# Patient Record
Sex: Female | Born: 1970 | Race: White | Hispanic: No | Marital: Single | Smoking: Never smoker
Health system: Southern US, Community
[De-identification: ages and names within clinical notes are randomized; demographics above are authoritative.]

## PROBLEM LIST (undated history)

## (undated) DIAGNOSIS — R51 Headache: Secondary | ICD-10-CM

## (undated) DIAGNOSIS — K219 Gastro-esophageal reflux disease without esophagitis: Secondary | ICD-10-CM

## (undated) DIAGNOSIS — R519 Headache, unspecified: Secondary | ICD-10-CM

## (undated) HISTORY — DX: Headache: R51

## (undated) HISTORY — DX: Headache, unspecified: R51.9

## (undated) HISTORY — DX: Gastro-esophageal reflux disease without esophagitis: K21.9

---

## 1998-01-05 HISTORY — PX: DILATION AND CURETTAGE OF UTERUS: SHX78

## 2004-01-06 HISTORY — PX: TUBAL LIGATION: SHX77

## 2004-10-13 ENCOUNTER — Inpatient Hospital Stay: Payer: Self-pay

## 2006-03-29 ENCOUNTER — Ambulatory Visit: Payer: Self-pay | Admitting: Gynecology

## 2006-05-19 ENCOUNTER — Ambulatory Visit: Payer: Self-pay

## 2006-05-25 ENCOUNTER — Ambulatory Visit: Payer: Self-pay

## 2006-07-15 ENCOUNTER — Ambulatory Visit: Payer: Self-pay

## 2007-01-06 HISTORY — PX: OVARY SURGERY: SHX727

## 2007-01-06 HISTORY — PX: LAPAROSCOPY: SHX197

## 2007-11-11 ENCOUNTER — Emergency Department: Payer: Self-pay | Admitting: Emergency Medicine

## 2009-07-26 ENCOUNTER — Emergency Department: Payer: Self-pay | Admitting: Emergency Medicine

## 2009-12-25 ENCOUNTER — Emergency Department: Payer: Self-pay | Admitting: Emergency Medicine

## 2010-01-01 ENCOUNTER — Emergency Department: Payer: Self-pay | Admitting: Emergency Medicine

## 2010-03-03 ENCOUNTER — Ambulatory Visit (HOSPITAL_COMMUNITY): Payer: Self-pay

## 2010-03-03 ENCOUNTER — Other Ambulatory Visit (HOSPITAL_COMMUNITY): Payer: Self-pay | Admitting: Family Medicine

## 2010-03-03 DIAGNOSIS — R05 Cough: Secondary | ICD-10-CM

## 2010-03-25 ENCOUNTER — Emergency Department: Payer: Self-pay | Admitting: Emergency Medicine

## 2010-10-31 ENCOUNTER — Ambulatory Visit: Payer: Self-pay | Admitting: Internal Medicine

## 2013-02-15 ENCOUNTER — Ambulatory Visit: Payer: Self-pay

## 2014-05-29 IMAGING — US US BREAST*R* LIMITED INC AXILLA
1 series · 4 of 4 positions shown · non-contrast
Comparison: 03/19/2006.

CLINICAL DATA: Patient complains of a palpable abnormality in the
upper-outer quadrant of the right breast.

EXAM:
DIGITAL DIAGNOSTIC  BILATERAL MAMMOGRAM WITH CAD
ULTRASOUND RIGHT BREAST

[Series 1: us breast*right* limited inc axilla · 0.08mm/px · 4 of 4 slices shown]
[im 1/4]
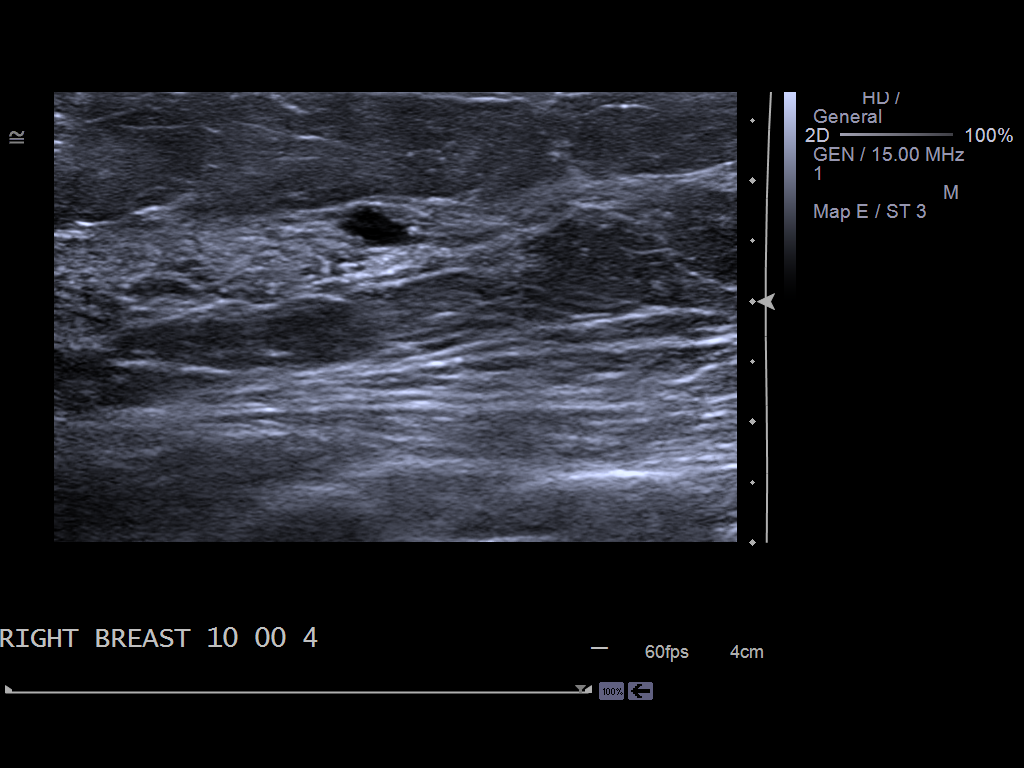
[im 2/4]
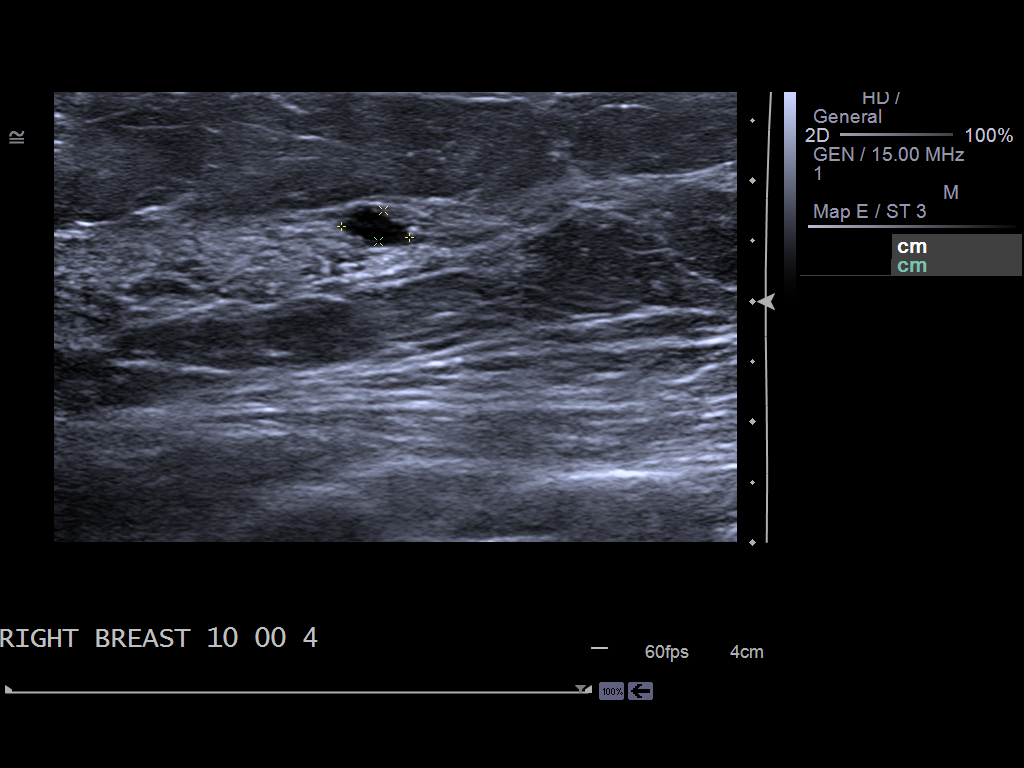
[im 3/4]
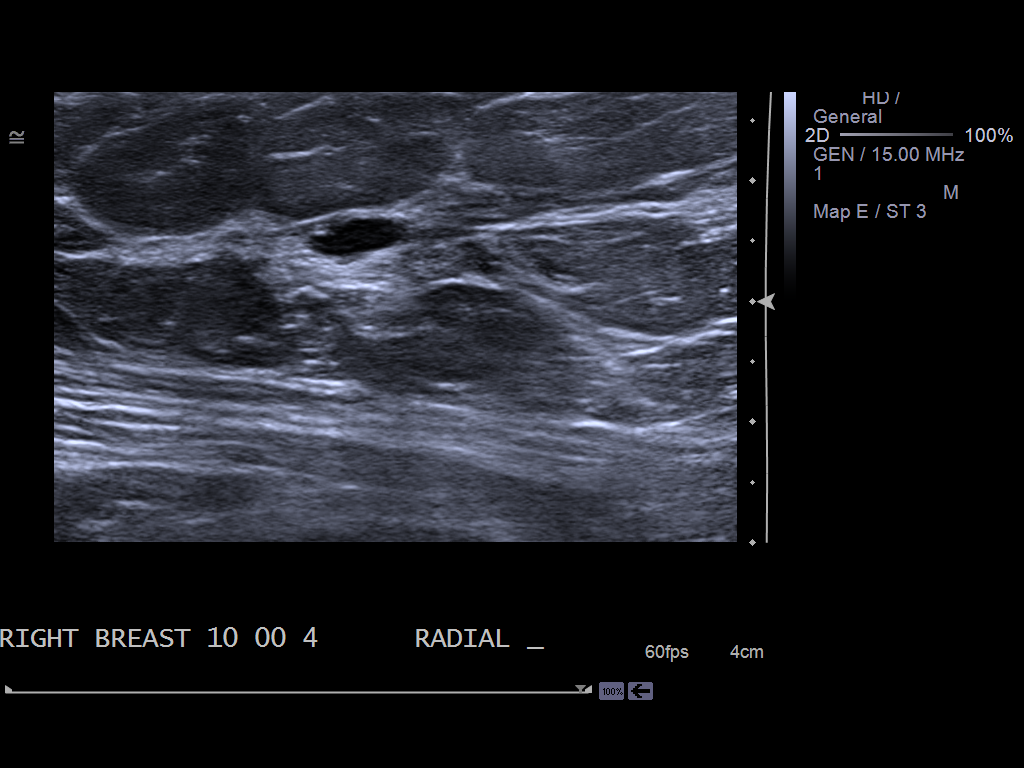
[im 4/4]
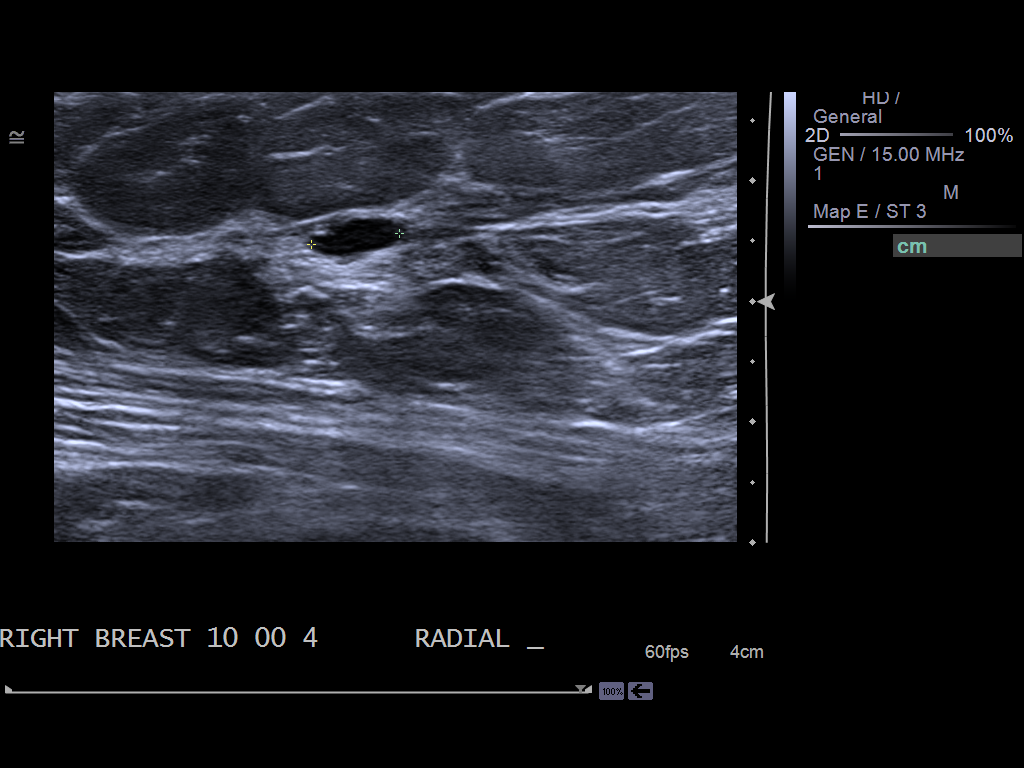

[4 of 4 positions shown; findings below may reference images not displayed]

ACR Breast Density Category b: There are scattered areas of
fibroglandular density.
FINDINGS: No suspicious mass, malignant type microcalcifications or distortion
detected in either breast.

Mammographic images were processed with CAD.

On physical exam, I do not palpate a mass in the right breast.

Ultrasound is performed, showing a simple cyst in the right breast
at 10 o'clock 4 cm from the nipple measuring 6 x 3 x 7 mm. No solid
mass or abnormal shadowing detected.
IMPRESSION: No evidence of malignancy in either breast.

RECOMMENDATION:
Bilateral screening mammogram in 1 year is recommended.

I have discussed the findings and recommendations with the patient.
Results were also provided in writing at the conclusion of the
visit. If applicable, a reminder letter will be sent to the patient
regarding the next appointment.

BI-RADS CATEGORY  2: Benign finding(s).

## 2014-11-12 ENCOUNTER — Ambulatory Visit: Payer: Self-pay | Attending: Oncology | Admitting: *Deleted

## 2014-11-12 ENCOUNTER — Encounter: Payer: Self-pay | Admitting: *Deleted

## 2014-11-12 VITALS — BP 132/82 | HR 68 | Temp 98.6°F | Resp 16 | Ht 61.42 in | Wt 168.8 lb

## 2014-11-12 DIAGNOSIS — N63 Unspecified lump in unspecified breast: Secondary | ICD-10-CM

## 2014-11-12 NOTE — Patient Instructions (Signed)
Gave patient hand-out, Women Staying Healthy, Active and Well from [facility], with education on breast health, pap smears, heart and colon health. 

## 2014-11-12 NOTE — Progress Notes (Signed)
Subjective:     Patient ID: Jenny Luna, female   DOB: Dec 29, 1970, 44 y.o.   MRN: [id number]  HPI   Review of Systems     Objective:   Physical Exam  Pulmonary/Chest: Right breast exhibits mass and tenderness. Right breast exhibits no inverted nipple, no nipple discharge and no skin change. Left breast exhibits no inverted nipple, no mass, no nipple discharge, no skin change and no tenderness. Breasts are symmetrical.         Assessment:      44 year [facility] White [facility]  States she can still feel a nodule in the right breast.  States she has tenderness when touched at the area.  Rates the tenderness a 5 on a 0/10 scale.  Last mammogram 2/15 was a birads 2 noting a right breast cyst at 10:00.  On clinical breast exam I was unable to palpate the area of concern until the patient pinpointed the area at 12:00.  There is a palpable 0.5 cm flat, mobile nodule at 12:00 right breast at the edge of the areola.  Taught self breast awareness. Patient has been screened for eligibility.  She does not have any insurance, Medicare or Medicaid.  She also meets financial eligibility.  Hand-out given on the Affordable Care Act.     Plan:     Bilateral diagnostic mammogram and ultrasound ordered and scheduled for 11/27/14 @ 9:00 for further evaluation of the right breast mass.

## 2014-11-27 ENCOUNTER — Ambulatory Visit
Admission: RE | Admit: 2014-11-27 | Discharge: 2014-11-27 | Disposition: A | Discharge status: Home / Self Care | Payer: Self-pay | Source: Ambulatory Visit | Attending: Oncology | Admitting: Oncology

## 2014-11-27 DIAGNOSIS — N63 Unspecified lump in unspecified breast: Secondary | ICD-10-CM

## 2014-11-28 ENCOUNTER — Telehealth: Payer: Self-pay | Admitting: *Deleted

## 2014-11-28 ENCOUNTER — Encounter: Payer: Self-pay | Admitting: General Surgery

## 2014-11-28 NOTE — Telephone Encounter (Signed)
Called patient today to discuss her normal mammogram and option to follow up with surgical consultation for further evaluation.  She would like a surgical consultation.  Patient is scheduled to see Dr. Lemar Luna on 12/11/14 @ 11:00.  She is to arrive 15-30 minutes early, take a photo ID and all her meds in the bottles.  Will follow up per Dr. Lemar Luna and protocol.

## 2014-12-11 ENCOUNTER — Other Ambulatory Visit: Payer: PRIVATE HEALTH INSURANCE

## 2014-12-11 ENCOUNTER — Encounter: Payer: Self-pay | Admitting: General Surgery

## 2014-12-11 ENCOUNTER — Ambulatory Visit (INDEPENDENT_AMBULATORY_CARE_PROVIDER_SITE_OTHER): Payer: PRIVATE HEALTH INSURANCE | Admitting: General Surgery

## 2014-12-11 VITALS — BP 128/68 | HR 62 | Resp 12 | Ht 59.7 in | Wt 169.0 lb

## 2014-12-11 DIAGNOSIS — N63 Unspecified lump in breast: Secondary | ICD-10-CM | POA: Diagnosis not present

## 2014-12-11 DIAGNOSIS — N6310 Unspecified lump in the right breast, unspecified quadrant: Secondary | ICD-10-CM

## 2014-12-11 NOTE — Patient Instructions (Signed)
Continue self breast exams. Call office for any new breast issues or concerns. 

## 2014-12-11 NOTE — Progress Notes (Signed)
Patient ID: Jenny Luna, female   DOB: 08-12-1970, 44 y.o.   MRN: [id number]  Chief Complaint  Patient presents with  . Breast Problem    right breast mass    HPI Jenny Luna is a 44 y.o. female.  who presents for a breast evaluation. The most recent mammogram was done on 11-27-14 with a right breast ultrasound.  Patient does perform regular self breast checks and gets regular mammograms done.  She states she can feel a lump in the right breast that is unchanged from 2015. She states it is about the size of a "pea". She states it has not changed in size. Occasionally she states it will be tender to touch between menstrual cycles. Denies family history of colon cancer. Her maternal grandmother had breast cancer and passed shortly after. I personally reviewed the patient's history. HPI  Past Medical History  Diagnosis Date  . Headache   . GERD (gastroesophageal reflux disease)     Past Surgical History  Procedure Laterality Date  . Dilation and curettage of uterus  2000  . Tubal ligation  2006  . Laparoscopy  2009  . Ovary surgery  2009    Cyst    Family History  Problem Relation Age of Onset  . Uterine cancer Mother 4530  . Breast cancer Maternal Grandmother [id number]    Social History Social History  Substance Use Topics  . Smoking status: Never Smoker   . Smokeless tobacco: Never Used  . Alcohol Use: 0.0 oz/week    0 Standard drinks or equivalent per week     Comment: occasionally    No Known Allergies  Current Outpatient Prescriptions  Medication Sig Dispense Refill  . ibuprofen (ADVIL,MOTRIN) 200 MG tablet Take 200 mg by mouth every 6 (six) hours as needed.     No current facility-administered medications for this visit.    Review of Systems Review of Systems  Constitutional: Negative.   Respiratory: Negative.   Cardiovascular: Negative.     Blood pressure 128/68, pulse 62, resp. rate 12, height 4' 11.7" (1.516 m), weight 169 lb (76.658 kg), last  menstrual period 11/15/2014.  Physical Exam Physical Exam  Constitutional: She is oriented to person, place, and time. She appears well-developed and well-nourished.  HENT:  Mouth/Throat: Oropharynx is clear and moist.  Eyes: Conjunctivae are normal. No scleral icterus.  Neck: Neck supple.  Cardiovascular: Normal rate, regular rhythm and normal heart sounds.   Pulmonary/Chest: Effort normal and breath sounds normal. Right breast exhibits mass. Right breast exhibits no inverted nipple, no nipple discharge, no skin change and no tenderness. Left breast exhibits no inverted nipple, no mass, no nipple discharge, no skin change and no tenderness.    Right breast nodule 10 o'clock 8 CFN.  Lymphadenopathy:    She has no cervical adenopathy.    She has no axillary adenopathy.  Neurological: She is alert and oriented to person, place, and time.  Skin: Skin is warm and dry.  Psychiatric: Her behavior is normal.    Data Reviewed 11/27/2014 bilateral mammogram and subsequent ultrasound reviewed. 2015 mammogram reviewed.  BI-RADS-2.  The area identified by the patient is distinctly more lateral and superior to the area reportedly [facility]. For that reason a repeat focused ultrasound of the upper-outer quadrant of the right breast was completed.  In the area of palpable thickening there is a poorly defined 0.4 x 0.4 x 0.46 cm nodule with central vascularity consistent with a lymph  node. No other area of architectural distortion, cyst or solid lesion is identified. BI-RADS-2.  Assessment    Benign breast exam, small palpable intramammary lymph node.    Plan    No indication for excision unless the area enlarges or becomes clinically symptomatic.    Follow up as needed or if symptoms worsen or change. Continue self breast exams. Call office for any new breast issues or concerns.   PCP:  Lenard Lance 12/12/2014, 5:35 PM

## 2014-12-12 DIAGNOSIS — N6310 Unspecified lump in the right breast, unspecified quadrant: Secondary | ICD-10-CM | POA: Insufficient documentation

## 2014-12-13 ENCOUNTER — Encounter: Payer: Self-pay | Admitting: *Deleted

## 2014-12-13 NOTE — Patient Instructions (Signed)
Patient saw Dr. Lemar LivingsByrnett for further evaluation.  Benign findings.  To follow-up in one year.  [facility] to [place].

## 2016-10-22 IMAGING — US US BREAST*R* LIMITED INC AXILLA
1 series · 10 of 10 positions shown · non-contrast
Comparison: none

[Series 1: us breast*right* limited inc axilla · 0.06mm/px · 10 of 10 slices shown]
[im 1/10]
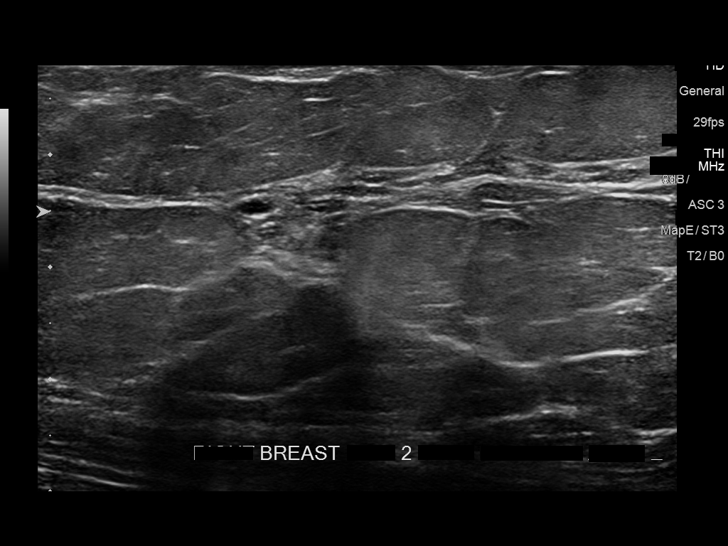
[im 2/10]
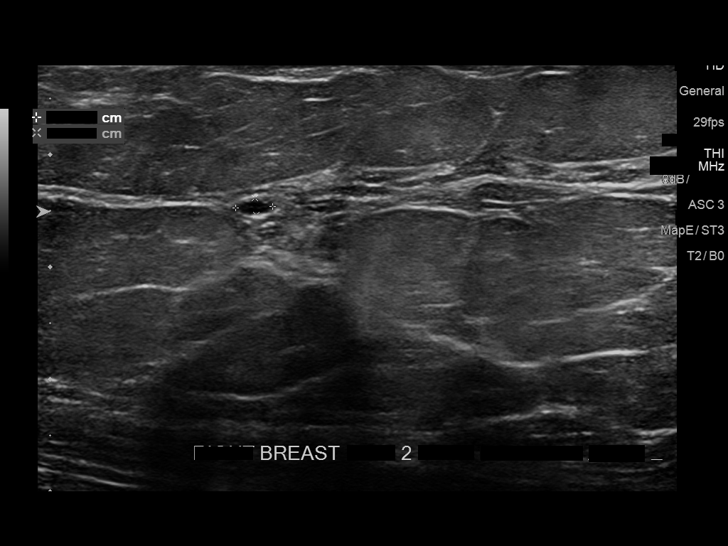
[im 3/10]
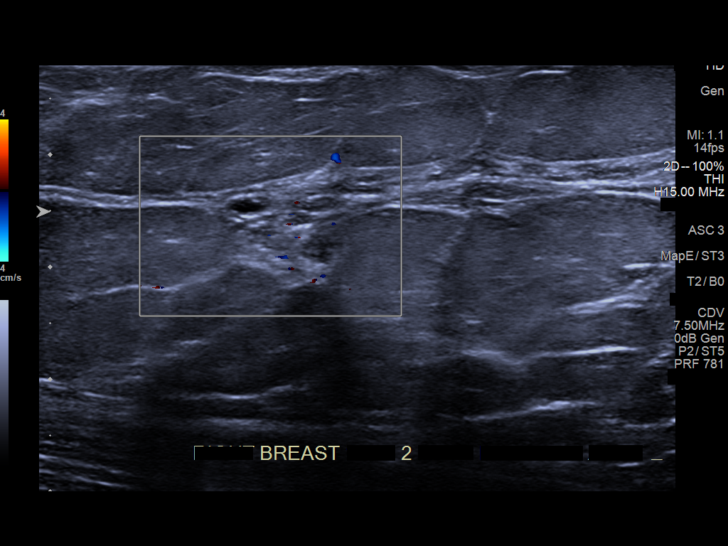
[im 4/10]
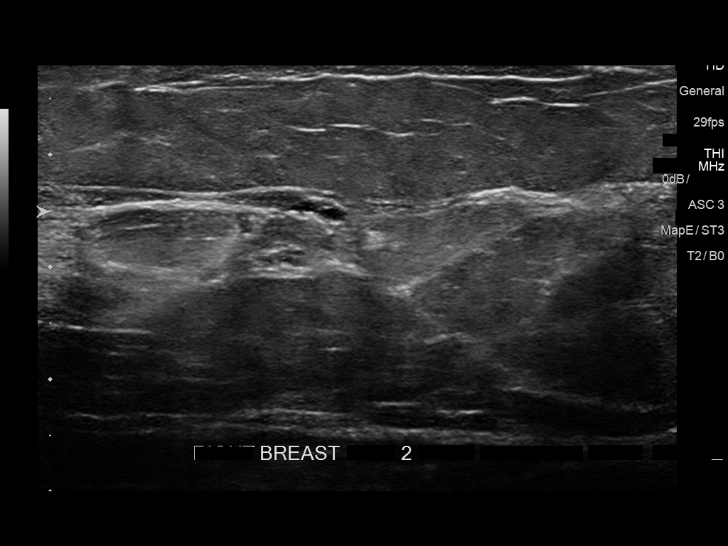
[im 5/10]
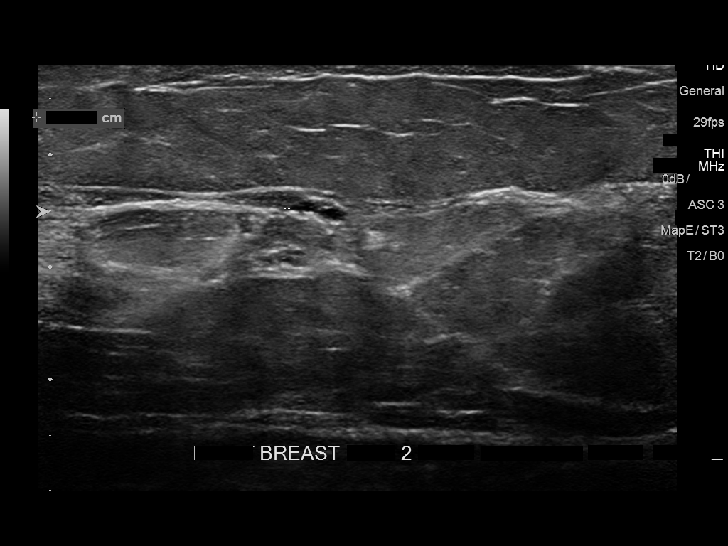
[im 6/10]
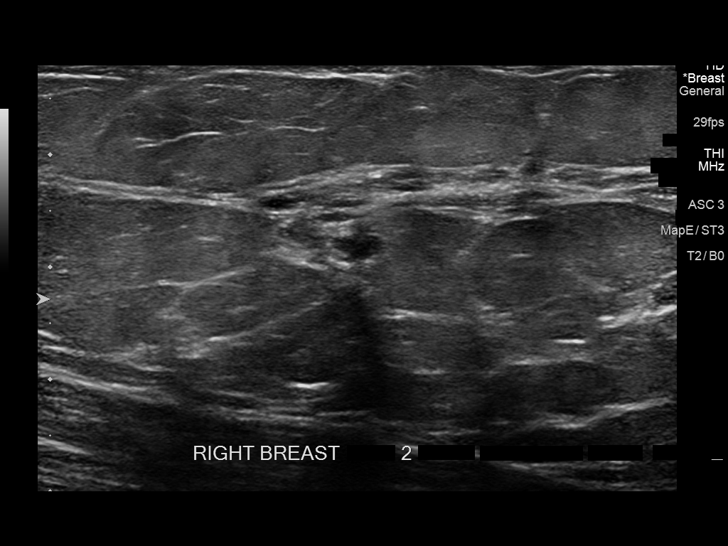
[im 7/10]
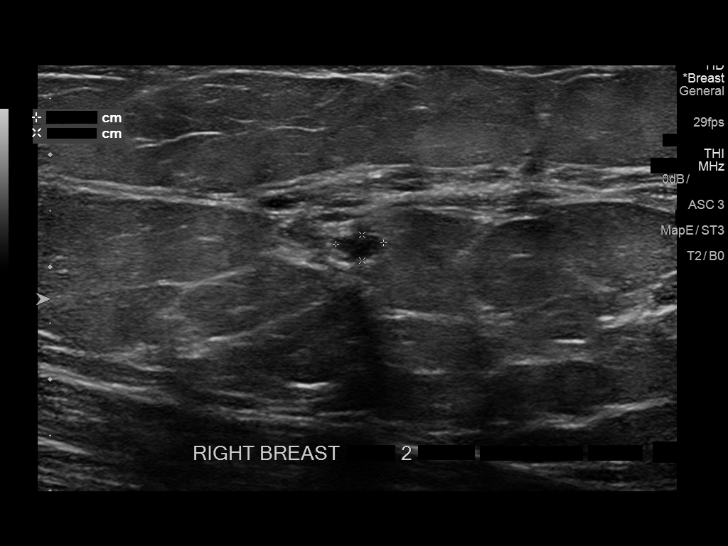
[im 8/10]
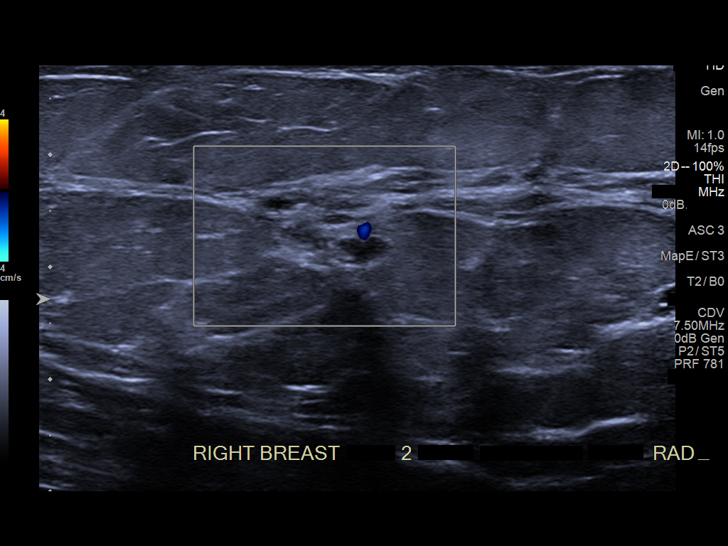
[im 9/10]
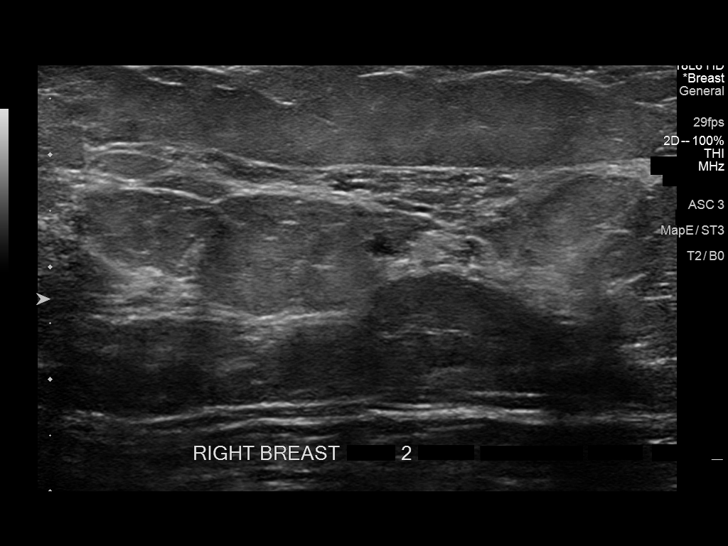
[im 10/10]
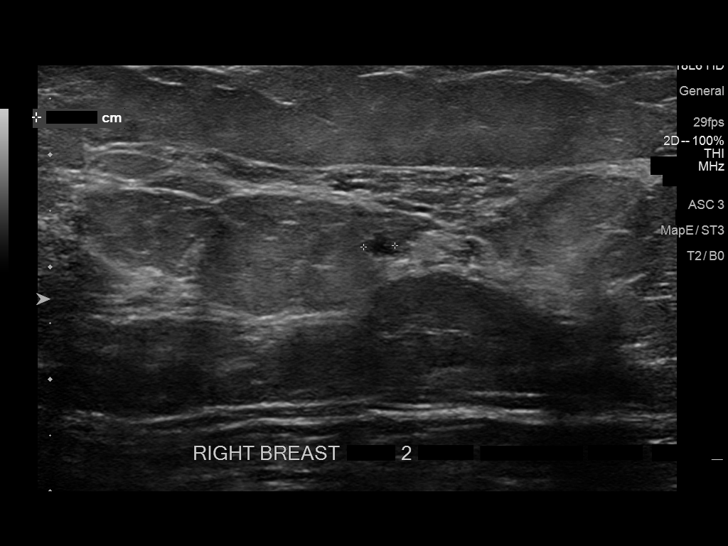

[10 of 10 positions shown; findings below may reference images not displayed]

Canned report from images found in remote index.

Refer to host system for actual result text.

## 2018-02-03 ENCOUNTER — Other Ambulatory Visit: Payer: Self-pay | Admitting: Physician Assistant

## 2019-01-11 ENCOUNTER — Ambulatory Visit
Admission: EM | Admit: 2019-01-11 | Discharge: 2019-01-11 | Disposition: A | Discharge status: Home / Self Care | Payer: BC Managed Care – PPO | Attending: Emergency Medicine | Admitting: Emergency Medicine

## 2019-01-11 ENCOUNTER — Other Ambulatory Visit: Payer: Self-pay

## 2019-01-11 DIAGNOSIS — J0100 Acute maxillary sinusitis, unspecified: Secondary | ICD-10-CM

## 2019-01-11 DIAGNOSIS — U071 COVID-19: Secondary | ICD-10-CM | POA: Diagnosis not present

## 2019-01-11 DIAGNOSIS — Z79899 Other long term (current) drug therapy: Secondary | ICD-10-CM | POA: Diagnosis not present

## 2019-01-11 DIAGNOSIS — K219 Gastro-esophageal reflux disease without esophagitis: Secondary | ICD-10-CM | POA: Insufficient documentation

## 2019-01-11 DIAGNOSIS — Z8616 Personal history of COVID-19: Secondary | ICD-10-CM

## 2019-01-11 DIAGNOSIS — J22 Unspecified acute lower respiratory infection: Secondary | ICD-10-CM | POA: Diagnosis present

## 2019-01-11 HISTORY — DX: Personal history of COVID-19: Z86.16

## 2019-01-11 MED ORDER — ALBUTEROL SULFATE HFA 108 (90 BASE) MCG/ACT IN AERS: 1.0000 | INHALATION_SPRAY | RESPIRATORY_TRACT | 0 refills | Status: AC | PRN

## 2019-01-11 MED ORDER — DOXYCYCLINE HYCLATE 100 MG PO CAPS: 100.0000 mg | ORAL_CAPSULE | Freq: Two times a day (BID) | ORAL | 0 refills | Status: AC

## 2019-01-11 MED ORDER — BENZONATATE 200 MG PO CAPS: 200.0000 mg | ORAL_CAPSULE | Freq: Three times a day (TID) | ORAL | 0 refills | Status: DC | PRN

## 2019-01-11 MED ORDER — AEROCHAMBER PLUS MISC: 2 refills | Status: DC

## 2019-01-11 MED ORDER — IBUPROFEN 600 MG PO TABS: 600.0000 mg | ORAL_TABLET | Freq: Four times a day (QID) | ORAL | 0 refills | Status: DC | PRN

## 2019-01-11 MED ORDER — FLUTICASONE PROPIONATE 50 MCG/ACT NA SUSP: 2.0000 | Freq: Every day | NASAL | 0 refills | Status: DC

## 2019-01-11 MED ORDER — HYDROCOD POLST-CPM POLST ER 10-8 MG/5ML PO SUER: 5.0000 mL | Freq: Two times a day (BID) | ORAL | 0 refills | Status: DC | PRN

## 2019-01-11 NOTE — ED Provider Notes (Signed)
HPI  SUBJECTIVE:  Jenny Luna is a 49 y.o. female who presents with 3 days of frontal and maxillary sinus pain and pressure, clear rhinorrhea, upper dental pain, postnasal drip.  She reports bilateral ear pain described as pressure and itching starting today.  No change in hearing, otorrhea.  She reports some wheezing, shortness of breath, dyspnea on exertion.  States that her chest and back are sore from the cough, denies other chest pain.  She reports a cough worse at night, states that she cannot sleep secondary to the cough.  No fevers, purulent nasal congestion, facial swelling, allergy symptoms.  No body aches, other headaches, sore throat, fatigue, loss of sense of smell or taste, nausea, vomiting, abdominal pain.  No known Covid exposure.  No antibiotics in the past month.  No antipyretic in the past 4 to 6 hours.  She tried Mucinex, DayQuil, NyQuil, pushing fluids without improvement in her symptoms.  Symptoms are worse during the day.  States that she gets this once a year and it is usually diagnosed as sinusitis/bronchitis.  Past medical history negative for asthma, emphysema, COPD, smoking, diabetes, coronary artery disease, chronic kidney disease, HIV, cancer, immunosuppression.  PMD: Plans to establish care with Dr. Franco Nones  Past Medical History:  Diagnosis Date  . GERD (gastroesophageal reflux disease)   . Headache     Past Surgical History:  Procedure Laterality Date  . DILATION AND CURETTAGE OF UTERUS  2000  . LAPAROSCOPY  2009  . OVARY SURGERY  2009   Cyst  . TUBAL LIGATION  2006    Family History  Problem Relation Age of Onset  . Uterine cancer Mother 31  . Breast cancer Maternal Grandmother 52    Social History   Tobacco Use  . Smoking status: Never Smoker  . Smokeless tobacco: Never Used  Substance Use Topics  . Alcohol use: Yes    Alcohol/week: 0.0 standard drinks    Comment: occasionally  . Drug use: No    No current facility-administered  medications for this encounter.  Current Outpatient Medications:  .  albuterol (VENTOLIN HFA) 108 (90 Base) MCG/ACT inhaler, Inhale 1-2 puffs into the lungs every 4 (four) hours as needed for wheezing or shortness of breath., Disp: 18 g, Rfl: 0 .  benzonatate (TESSALON) 200 MG capsule, Take 1 capsule (200 mg total) by mouth 3 (three) times daily as needed for cough., Disp: 30 capsule, Rfl: 0 .  chlorpheniramine-HYDROcodone (TUSSIONEX PENNKINETIC ER) 10-8 MG/5ML SUER, Take 5 mLs by mouth every 12 (twelve) hours as needed for cough., Disp: 60 mL, Rfl: 0 .  fluticasone (FLONASE) 50 MCG/ACT nasal spray, Place 2 sprays into both nostrils daily., Disp: 16 g, Rfl: 0 .  ibuprofen (ADVIL) 600 MG tablet, Take 1 tablet (600 mg total) by mouth every 6 (six) hours as needed., Disp: 30 tablet, Rfl: 0 .  Spacer/Aero-Holding Chambers (AEROCHAMBER PLUS) inhaler, Use as instructed, Disp: 1 each, Rfl: 2  No Known Allergies   ROS  As noted in HPI.   Physical Exam  BP 140/87 (BP Location: Left Arm)   Pulse 77   Temp 98.4 F (36.9 C) (Oral)   Resp 18   Ht 5' (1.524 m)   Wt 81.6 kg   LMP 12/28/2018   SpO2 99%   BMI 35.15 kg/m   Constitutional: Well developed, well nourished, no acute distress Eyes:  EOMI, conjunctiva normal bilaterally HENT: Normocephalic, atraumatic,mucus membranes moist TMs normal bilaterally.  Erythematous, swollen turbinates.  Mucoid nasal  discharge.  Positive maxillary sinus tenderness.  No frontal sinus tenderness.  Positive postnasal drip. Respiratory: Normal inspiratory effort, lungs clear bilaterally, positive anterior, lateral chest wall tenderness Cardiovascular: Normal rate and rhythm no murmurs rubs or gallops GI: nondistended skin: No rash, skin intact Musculoskeletal: no deformities Neurologic: Alert & oriented x 3, no focal neuro deficits Psychiatric: Speech and behavior appropriate   ED Course   Medications - No data to display  Orders Placed This Encounter   Procedures  . Novel Coronavirus, N[facility] (Hosp order, Send-out to Ref Lab; TAT 18-24 hrs    Standing Status:   Standing    Number of Occurrences:   1    Order Specific Question:   Is this test for diagnosis or screening    Answer:   Diagnosis of ill patient    Order Specific Question:   Symptomatic for COVID-19 as defined by CDC    Answer:   Yes    Order Specific Question:   Date of Symptom Onset    Answer:   01/08/2019    Order Specific Question:   Hospitalized for COVID-19    Answer:   No    Order Specific Question:   Admitted to ICU for COVID-19    Answer:   No    Order Specific Question:   Previously tested for COVID-19    Answer:   No    Order Specific Question:   Resident in a congregate (group) care setting    Answer:   No    Order Specific Question:   Employed in healthcare setting    Answer:   No    Order Specific Question:   Pregnant    Answer:   No    No results found for this or any previous visit (from the past 24 hour(s)). No results found.  ED Clinical Impression  1. Acute non-recurrent maxillary sinusitis   2. LRTI (lower respiratory tract infection)      ED Assessment/Plan  [place] Narc[place] reviewed for this patient, and feel that the risk/benefit ratio today is favorable for proceeding with a prescription for controlled substance.  No opiate prescriptions  2 years  Patient with a sinusitis, most likely viral.  No clear indications for antibiotics.  She also has lower respiratory tract infection.  Sending home with Flonase, saline nasal irrigation, continue Mucinex D.  Ibuprofen 600 mg combined with 1 g of Tylenol 3-4 times a day as needed for pain.  2 puffs from an albuterol inhaler with a spacer as needed for coughing, wheezing, shortness of breath.  Tessalon for the cough during the day, tussionex for the cough at night.  Wait-and-see prescription of doxycycline.  Discussed indications for starting this.  Covid PCR test sent.  Discussed  MDM, treatment plan,  and plan for follow-up with patient. patient agrees with plan.   Meds ordered this encounter  Medications  . albuterol (VENTOLIN HFA) 108 (90 Base) MCG/ACT inhaler    Sig: Inhale 1-2 puffs into the lungs every 4 (four) hours as needed for wheezing or shortness of breath.    Dispense:  18 g    Refill:  0  . fluticasone (FLONASE) 50 MCG/ACT nasal spray    Sig: Place 2 sprays into both nostrils daily.    Dispense:  16 g    Refill:  0  . Spacer/Aero-Holding Chambers (AEROCHAMBER PLUS) inhaler    Sig: Use as instructed    Dispense:  1 each    Refill:  2  .  chlorpheniramine-HYDROcodone (TUSSIONEX PENNKINETIC ER) 10-8 MG/5ML SUER    Sig: Take 5 mLs by mouth every 12 (twelve) hours as needed for cough.    Dispense:  60 mL    Refill:  0  . benzonatate (TESSALON) 200 MG capsule    Sig: Take 1 capsule (200 mg total) by mouth 3 (three) times daily as needed for cough.    Dispense:  30 capsule    Refill:  0  . ibuprofen (ADVIL) 600 MG tablet    Sig: Take 1 tablet (600 mg total) by mouth every 6 (six) hours as needed.    Dispense:  30 tablet    Refill:  0    *This clinic note was created using Scientist, clinical (histocompatibility and immunogenetics). Therefore, there may be occasional mistakes despite careful proofreading.   ?    Domenick Gong, MD 01/12/19 1135

## 2019-01-11 NOTE — ED Triage Notes (Signed)
Patient states that she has been having a cough with sinus pressure and pain. Patient states that she is also having a headache. Patient states that she was around a girl who possibly is positive for covid.

## 2019-01-11 NOTE — Discharge Instructions (Addendum)
Take the medication as written. Start Mucinex-D to keep the mucous thin and to decongest you.  Stop the DayQuil and NyQuil.  Return to the ER if you get worse, have a fever >100.4, or for any concerns. You may take 600 mg of motrin with 1 gram of tylenol up to 3-4 times a day as needed for pain. This is an effective combination for pain.  Most sinus infections are viral and do not need antibiotics unless you have a high fever, have had this for 10 days, or you get better and then get sick again. Use a NeilMed sinus rinse as often as you want to to reduce nasal congestion. Follow the directions on the box.    2 puffs from an albuterol inhaler with a spacer as needed for coughing, wheezing, shortness of breath.  Tessalon for the cough during the day, tussionex for the cough at night.  Wait-and-see prescription of doxycycline.   Go to www.goodrx.com to look up your medications. This will give you a list of where you can find your prescriptions at the most affordable prices. Or you can ask the pharmacist what the cash price is. This is frequently cheaper than going through insurance.

## 2019-01-12 LAB — NOVEL CORONAVIRUS, NAA (HOSP ORDER, SEND-OUT TO REF LAB; TAT 18-24 HRS): SARS-CoV-2, NAA: DETECTED — AB

## 2019-01-13 ENCOUNTER — Telehealth: Payer: Self-pay | Admitting: Emergency Medicine

## 2019-01-13 NOTE — Telephone Encounter (Signed)

## 2019-01-13 NOTE — Telephone Encounter (Signed)
Patient contacted by phone and made aware of   pos covid results. Pt verbalized understanding and had all questions answered.    

## 2019-04-20 ENCOUNTER — Other Ambulatory Visit: Payer: Self-pay

## 2019-04-20 ENCOUNTER — Ambulatory Visit
Admission: EM | Admit: 2019-04-20 | Discharge: 2019-04-20 | Disposition: A | Discharge status: Home / Self Care | Payer: BC Managed Care – PPO | Attending: Urgent Care | Admitting: Urgent Care

## 2019-04-20 DIAGNOSIS — H748X3 Other specified disorders of middle ear and mastoid, bilateral: Secondary | ICD-10-CM | POA: Diagnosis not present

## 2019-04-20 DIAGNOSIS — J0100 Acute maxillary sinusitis, unspecified: Secondary | ICD-10-CM

## 2019-04-20 DIAGNOSIS — R42 Dizziness and giddiness: Secondary | ICD-10-CM

## 2019-04-20 DIAGNOSIS — H6593 Unspecified nonsuppurative otitis media, bilateral: Secondary | ICD-10-CM

## 2019-04-20 MED ORDER — AMOXICILLIN-POT CLAVULANATE 875-125 MG PO TABS: 1.0000 | ORAL_TABLET | Freq: Two times a day (BID) | ORAL | 0 refills | Status: AC

## 2019-04-20 MED ORDER — MECLIZINE HCL 25 MG PO TABS: 25.0000 mg | ORAL_TABLET | Freq: Three times a day (TID) | ORAL | 0 refills | Status: AC | PRN

## 2019-04-20 NOTE — Discharge Instructions (Addendum)
It was very nice seeing you today in clinic. Thank you for entrusting me with your care.   Rest and increase fluid intake. Add Sudafed to help clear/dry up effusions.   Make arrangements to follow up with your regular doctor in 1 week for re-evaluation if not improving. If your symptoms/condition worsens, please seek follow up care either here or in the ER. Please remember, our [facility] providers are "right here with you" when you need [place].   Again, it was my pleasure to take care of you today. Thank you for choosing our clinic. I hope that you start to feel better quickly.   Quentin Mulling, MSN, APRN, FNP-C, CEN Advanced Practice Provider [facility]

## 2019-04-20 NOTE — ED Provider Notes (Signed)
Luna, Jenny   Name: Jenny Luna DOB: Nov 20, 1970 MRN: [id number] CSN: [id number] PCP: Patient, No Pcp Per  Arrival date and time:  04/20/19 2671  Chief Complaint:  Dizziness  NOTE: Prior to seeing the patient today, I have reviewed the triage nursing documentation and vital signs. Clinical staff has updated patient's PMH/PSHx, current medication list, and drug allergies/intolerances to ensure comprehensive history available to assist in medical decision making.   History:   HPI: Jenny Luna is a 49 y.o. female who presents today with complaints of sinus drainage, paranasal sinus tenderness, and RIGHT ear pain that started about 2 weeks ago. Patient denies fevers. She denies any cough, shortness of breath, or wheezing. She woke up with morning with vertiginous symptoms. Patient is eating and drinking well. She has had nausea with no episodes of vomiting. She has not had any diarrhea or abdominal pain. Patient denies being in close contact with anyone known to be ill. Patient was diagnosed with SARS-CoV-2 (novel coronavirus) back in 01/2019. In efforts to conservatively manage her symptoms at home, the patient notes that she has used meclizine and guaifenesin, which has helped to improve her symptoms some.    Past Medical History:  Diagnosis Date  . GERD (gastroesophageal reflux disease)   . Headache   . History of 2019 novel coronavirus disease (COVID-19) 01/11/2019    Past Surgical History:  Procedure Laterality Date  . DILATION AND CURETTAGE OF UTERUS  2000  . LAPAROSCOPY  2009  . OVARY SURGERY  2009   Cyst  . TUBAL LIGATION  2006    Family History  Problem Relation Age of Onset  . Uterine cancer Mother 32  . Breast cancer Maternal Grandmother 51    Social History   Tobacco Use  . Smoking status: Never Smoker  . Smokeless tobacco: Never Used  Substance Use Topics  . Alcohol use: Yes    Alcohol/week: 0.0 standard drinks    Comment: occasionally  . Drug use:  No    Patient Active Problem List   Diagnosis Date Noted  . Breast mass, right 12/12/2014    Home Medications:    No outpatient medications have been marked as taking for the 04/20/19 encounter [facility] Encounter).    Allergies:   Patient has no known allergies.  Review of Systems (ROS):  Review of systems NEGATIVE unless otherwise noted in narrative H&P section.   Vital Signs: Today's Vitals   04/20/19 0817 04/20/19 0819 04/20/19 0838  BP:  132/78   Pulse:  (!) 56   Resp:  18   Temp:  98.1 F (36.7 C)   TempSrc:  Oral   SpO2:  100%   Weight: 185 lb (83.9 kg)    Height: 4' 11.75" (1.518 m)    PainSc: 0-No pain  0-No pain    Physical Exam: Physical Exam  Constitutional: She is oriented to person, place, and time and well-developed, well-nourished, and in no distress.  HENT:  Head: Normocephalic and atraumatic.  Right Ear: There is tenderness. Tympanic membrane is erythematous. A middle ear effusion is present.  Left Ear: A middle ear effusion is present.  Nose: Mucosal edema and rhinorrhea present. Right sinus exhibits maxillary sinus tenderness.  Mouth/Throat: Uvula is midline and mucous membranes are normal. Posterior oropharyngeal erythema present. No oropharyngeal exudate or posterior oropharyngeal edema.  Eyes: Pupils are equal, round, and reactive to light.  Cardiovascular: Normal rate, regular rhythm, normal heart sounds and intact distal pulses.  Pulmonary/Chest: Effort normal and  breath sounds normal.  Lymphadenopathy:       Head (right side): Submandibular adenopathy present.  Neurological: She is alert and oriented to person, place, and time. She has normal sensation and intact cranial nerves. Gait normal.  Skin: Skin is warm and dry. No rash noted. She is not diaphoretic.  Psychiatric: Mood, memory, affect and judgment normal.  Nursing note and vitals reviewed.   Urgent Care Treatments / Results:   No orders of the defined types were placed in this  encounter.   LABS: PLEASE NOTE: all labs that were ordered this encounter are listed, however only abnormal results are displayed. Labs Reviewed - No data to display  EKG: -None  RADIOLOGY: No results found.  PROCEDURES: Procedures  MEDICATIONS RECEIVED THIS VISIT: Medications - No data to display  PERTINENT CLINICAL COURSE NOTES/UPDATES:   Initial Impression / Assessment and Plan / Urgent Care Course:  Pertinent labs & imaging results that were available during my care of the patient were personally reviewed by me and considered in my medical decision making (see lab/imaging section of note for values and interpretations).  Jenny Luna is a 49 y.o. female who presents to [facility] today with complaints of Dizziness  Patient is well appearing overall in clinic today. She does not appear to be in any acute distress. Presenting symptoms (see HPI) and exam as documented above. Symptoms have been present x 2 weeks ago. Patient developed vertiginous symptoms this morning. Will cover for an acute maxillary sinusitis using a 7 day course of amoxicillin-clavulanate. Discussed supportive care measures at home during acute phase of illness. Patient to rest as much as possible. She was encouraged to ensure adequate hydration (water and ORS) to prevent dehydration and electrolyte derangements. Patient may use APAP and/or IBU on an as needed basis for pain/fever. Will also send in a prescription for a PRN supply of meclizine. Patient advised to use Sudafed to help abate MEEs.   Discussed follow up with primary care physician in 1 week for re-evaluation. I have reviewed the follow up and strict return precautions for any new or worsening symptoms. Patient is aware of symptoms that would be deemed urgent/emergent, and would thus require further evaluation either here or in the emergency department. At the time of discharge, she verbalized understanding and consent with the discharge plan  as it was reviewed with her. All questions were fielded by provider and/or clinic staff prior to patient discharge.    Final Clinical Impressions / Urgent Care Diagnoses:   Final diagnoses:  Acute maxillary sinusitis, recurrence not specified  Dizziness  MEE (middle ear effusion), bilateral    New Prescriptions:  [place] Controlled Substance Registry consulted? Not Applicable  Meds ordered this encounter  Medications  . amoxicillin-clavulanate (AUGMENTIN) 875-125 MG tablet    Sig: Take 1 tablet by mouth 2 (two) times daily for 7 days.    Dispense:  14 tablet    Refill:  0  . meclizine (ANTIVERT) 25 MG tablet    Sig: Take 1 tablet (25 mg total) by mouth 3 (three) times daily as needed for dizziness.    Dispense:  30 tablet    Refill:  0    Recommended Follow up Care:  Patient encouraged to follow up with the following provider within the specified time frame, or sooner as dictated by the severity of her symptoms. As always, she was instructed that for any urgent/emergent care needs, she should seek care either here or in the emergency department  for more immediate evaluation.  Follow-up Information    PCP In 1 week.   Why: General reassessment of symptoms if not improving        NOTE: This note was prepared using Scientist, clinical (histocompatibility and immunogenetics) along with smaller Lobbyist. Despite my best ability to proofread, there is the potential that transcriptional errors may still occur from this process, and are completely unintentional.    Verlee Monte, NP 04/20/19 1910

## 2019-04-20 NOTE — ED Triage Notes (Signed)
Pt presents with c/o episodes of vertigo that started this morning. Pt does have some sinus congestion, right ear pain and nausea. Pt denies fever/chills, v/d or other symptoms. Pt has had episodes of vertigo in the past.

## 2021-06-11 ENCOUNTER — Encounter: Payer: Self-pay | Admitting: Obstetrics and Gynecology

## 2021-06-17 ENCOUNTER — Encounter: Payer: Self-pay | Admitting: Obstetrics and Gynecology

## 2021-06-17 ENCOUNTER — Ambulatory Visit (INDEPENDENT_AMBULATORY_CARE_PROVIDER_SITE_OTHER): Payer: BC Managed Care – PPO | Admitting: Obstetrics and Gynecology

## 2021-06-17 VITALS — BP 132/79 | HR 78 | Ht 59.75 in | Wt 179.3 lb

## 2021-06-17 DIAGNOSIS — Z7689 Persons encountering health services in other specified circumstances: Secondary | ICD-10-CM | POA: Diagnosis not present

## 2021-06-17 DIAGNOSIS — N938 Other specified abnormal uterine and vaginal bleeding: Secondary | ICD-10-CM | POA: Diagnosis not present

## 2021-06-17 DIAGNOSIS — N926 Irregular menstruation, unspecified: Secondary | ICD-10-CM

## 2021-06-17 DIAGNOSIS — Z3009 Encounter for other general counseling and advice on contraception: Secondary | ICD-10-CM

## 2021-06-17 MED ORDER — NORETHIN ACE-ETH ESTRAD-FE 1.5-30 MG-MCG PO TABS: 1.0000 | ORAL_TABLET | Freq: Every day | ORAL | 1 refills | Status: DC

## 2021-06-17 NOTE — Progress Notes (Signed)
HPI:      [name] is a 51 y.o. G5P0010 who LMP was Patient's last menstrual period was 06/15/2021 (exact date).  Subjective:   She presents today with complaint of heavy irregular cycles.  She states over the last 9 months she has had heavy vaginal bleeding with her menses.  She has also had some menses that last up to 3 weeks long.  She does complain of hot flashes.  She was prescribed a 82-month birth control pill which did not control her cycle.  She continued to have bleeding 1 time per month on that pill. She also states that she has a history of "2 uteruses".  When questioned she describes this as a single cervix but to uteri.  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 She does not have any pertinent problems on file. She  has a past surgical history that includes Dilation and curettage of uterus (2000); Tubal ligation (2006); laparoscopy (2009); and Ovary surgery (2009). Her family history includes Breast cancer (age of onset: 33) in her maternal grandmother; Uterine cancer (age of onset: 34) in her mother. She  reports that she has never smoked. She has never used smokeless tobacco. She reports that she does not currently use alcohol. She reports that she does not use drugs. She has a current medication list which includes the following prescription(s): albuterol, ibuprofen, meclizine, and norethindrone-ethinyl estradiol-iron. She has No Known Allergies.       Review of Systems:  Review of Systems  Constitutional: Denied constitutional symptoms, night sweats, recent illness, fatigue, fever, insomnia and weight loss.  Eyes: Denied eye symptoms, eye pain, photophobia, vision change and visual disturbance.   Ears/Nose/Throat/Neck: Denied ear, nose, throat or neck symptoms, hearing loss, nasal discharge, sinus congestion and sore throat.  Cardiovascular: Denied cardiovascular symptoms, arrhythmia, chest pain/pressure, edema, exercise intolerance, orthopnea and palpitations.  Respiratory: Denied pulmonary symptoms, asthma, pleuritic pain, productive sputum, cough, dyspnea and wheezing.  Gastrointestinal: Denied, gastro-esophageal reflux, melena, nausea and vomiting.  Genitourinary: See HPI for additional information.  Musculoskeletal: Denied musculoskeletal symptoms, stiffness, swelling, muscle weakness and myalgia.  Dermatologic: Denied dermatology symptoms, rash and scar.  Neurologic: Denied neurology symptoms, dizziness, headache, neck pain and syncope.  Psychiatric: Denied psychiatric symptoms, anxiety and depression.  Endocrine: Denied endocrine symptoms including hot flashes and night sweats.   Meds:   Current Outpatient Medications on File Prior to Visit  Medication Sig Dispense Refill   albuterol (VENTOLIN HFA) 108 (90 Base) MCG/ACT inhaler Inhale 1-2 puffs into the lungs every 4 (four) hours as needed for wheezing or shortness of breath. 18 g 0   ibuprofen (ADVIL) 600 MG tablet Take 1 tablet (600 mg total) by mouth every 6 (six) hours as needed. 30 tablet 0   meclizine (ANTIVERT) 25 MG tablet Take 1 tablet (25 mg total) by mouth 3 (three) times daily as needed for dizziness. 30 tablet 0   No current facility-administered medications on file prior to visit.      Objective:     Vitals:   06/17/21 1346  BP: 132/79  Pulse: 78   Filed Weights   06/17/21 1346  Weight: 179 lb 4.8 oz (81.3 kg)  Assessment:    G5P0010 Patient Active Problem List   Diagnosis Date Noted   Breast mass, right 12/12/2014     1. Establishing care with new doctor, encounter for   2. Irregular menstrual bleeding   3. DUB (dysfunctional uterine bleeding)     Patient with  dysfunctional uterine bleeding which is likely secondary to perimenopause.   Plan:            1.  Discussed multiple methods of cycle control patient has chosen a different form of OCPs for better cycle control.  Should she fail OCPs would strongly recommend endometrial biopsy.  Endometrial hyperplasia and endometrial cancer discussed in detail. IUD may not be appropriate if patient has uterine didelphys or bicornuate uterus.  Would require ultrasound investigation first. All questions answered. OCPs The risks /benefits of OCPs have been explained to the patient in detail.  Product literature has been given to her where appropriate.  I have instructed her in the use of OCPs.  I have explained to the patient that OCPs are not as effective for birth control during the first month of use, and that another form of contraception should be used during this time.  Both first-day start and Sunday start have been explained.  The risks and benefits of each was discussed.  She has been made aware of  the fact that in rare circumstances, other medications may affect the efficacy of OCPs.  I have answered all of her questions, and I believe that she has an understanding of the effectiveness and use of OCPs.  Orders No orders of the defined types were placed in this encounter.    Meds ordered this encounter  Medications   norethindrone-ethinyl estradiol-iron (LOESTRIN FE 1.5/30) 1.5-30 MG-MCG tablet    Sig: Take 1 tablet by mouth at bedtime.    Dispense:  84 tablet    Refill:  1      F/U  Return in about 3 months (around 09/17/2021).  Elonda Husky, M.D. 06/17/2021 2:19 PM

## 2021-06-17 NOTE — Progress Notes (Signed)
Patient presents today for lengthy irregular menstrual cycles. She states over the last 9 months she had has a menstrual cycle that varies from 7-20+ days, heavy bleeding along with moderate cramping. She states ongoing hot flashes and weight gain, no other menopausal symptoms. Patient states no other questions or concerns.

## 2021-09-05 ENCOUNTER — Other Ambulatory Visit: Payer: Self-pay | Admitting: Obstetrics and Gynecology

## 2021-09-05 DIAGNOSIS — N938 Other specified abnormal uterine and vaginal bleeding: Secondary | ICD-10-CM

## 2021-09-23 ENCOUNTER — Encounter: Payer: BC Managed Care – PPO | Admitting: Obstetrics and Gynecology

## 2021-09-23 DIAGNOSIS — N938 Other specified abnormal uterine and vaginal bleeding: Secondary | ICD-10-CM

## 2021-11-06 ENCOUNTER — Encounter: Payer: Self-pay | Admitting: Obstetrics and Gynecology

## 2021-11-06 ENCOUNTER — Ambulatory Visit: Payer: BC Managed Care – PPO | Admitting: Obstetrics and Gynecology

## 2021-11-06 VITALS — BP 132/82 | HR 64 | Ht 59.75 in | Wt 182.5 lb

## 2021-11-06 DIAGNOSIS — N938 Other specified abnormal uterine and vaginal bleeding: Secondary | ICD-10-CM

## 2021-11-06 DIAGNOSIS — N926 Irregular menstruation, unspecified: Secondary | ICD-10-CM | POA: Diagnosis not present

## 2021-11-06 DIAGNOSIS — R399 Unspecified symptoms and signs involving the genitourinary system: Secondary | ICD-10-CM | POA: Diagnosis not present

## 2021-11-06 DIAGNOSIS — R319 Hematuria, unspecified: Secondary | ICD-10-CM

## 2021-11-06 LAB — POCT URINALYSIS DIPSTICK
Bilirubin, UA: NEGATIVE
Glucose, UA: NEGATIVE
Ketones, UA: NEGATIVE
Leukocytes, UA: NEGATIVE
Protein, UA: NEGATIVE
Spec Grav, UA: 1.02 (ref 1.010–1.025)
Urobilinogen, UA: 0.2 E.U./dL
pH, UA: 6 (ref 5.0–8.0)

## 2021-11-06 MED ORDER — NITROFURANTOIN MONOHYD MACRO 100 MG PO CAPS: 100.0000 mg | ORAL_CAPSULE | Freq: Two times a day (BID) | ORAL | 0 refills | Status: AC

## 2021-11-06 MED ORDER — NORETHIN ACE-ETH ESTRAD-FE 1.5-30 MG-MCG PO TABS: 1.0000 | ORAL_TABLET | Freq: Every day | ORAL | 2 refills | Status: DC

## 2021-11-06 NOTE — Progress Notes (Signed)
HPI:      [name] is a 51 y.o. G5P0010 who LMP was Patient's last menstrual period was 10/29/2021.  Subjective:   She presents today stating that she likes being on OCPs.  Her cycle length has decreased and it is not as heavy and not as uncomfortable with dysmenorrhea.  She reports that her hot flashes have declined significantly. Today she states that she has had some pressure with urination and some lower back pain and would like her urine checked for possible bladder infection.    Hx: The following portions of the patient's history were reviewed and updated as appropriate:             She  has a past medical history of GERD (gastroesophageal reflux disease), Headache, and History of 2019 novel coronavirus disease (COVID-19) (01/11/2019). She does not have any pertinent problems on file. She  has a past surgical history that includes Dilation and curettage of uterus (2000); Tubal ligation (2006); laparoscopy (2009); and Ovary surgery (2009). Her family history includes Breast cancer (age of onset: 2) in her maternal grandmother; Uterine cancer (age of onset: 36) in her mother. She  reports that she has never smoked. She has never used smokeless tobacco. She reports that she does not currently use alcohol. She reports that she does not use drugs. She has a current medication list which includes the following prescription(s): albuterol, ibuprofen, meclizine, nitrofurantoin (macrocrystal-monohydrate), and norethindrone-ethinyl estradiol-iron. She has No Known Allergies.       Review of Systems:  Review of Systems  Constitutional: Denied constitutional symptoms, night sweats, recent illness, fatigue, fever, insomnia and weight loss.  Eyes: Denied eye symptoms, eye pain, photophobia, vision change and visual disturbance.  Ears/Nose/Throat/Neck: Denied ear, nose, throat or neck symptoms, hearing loss, nasal discharge, sinus congestion and sore throat.  Cardiovascular: Denied  cardiovascular symptoms, arrhythmia, chest pain/pressure, edema, exercise intolerance, orthopnea and palpitations.  Respiratory: Denied pulmonary symptoms, asthma, pleuritic pain, productive sputum, cough, dyspnea and wheezing.  Gastrointestinal: Denied, gastro-esophageal reflux, melena, nausea and vomiting.  Genitourinary: See HPI for additional information.  Musculoskeletal: Denied musculoskeletal symptoms, stiffness, swelling, muscle weakness and myalgia.  Dermatologic: Denied dermatology symptoms, rash and scar.  Neurologic: Denied neurology symptoms, dizziness, headache, neck pain and syncope.  Psychiatric: Denied psychiatric symptoms, anxiety and depression.  Endocrine: Denied endocrine symptoms including hot flashes and night sweats.   Meds:   Current Outpatient Medications on File Prior to Visit  Medication Sig Dispense Refill   albuterol (VENTOLIN HFA) 108 (90 Base) MCG/ACT inhaler Inhale 1-2 puffs into the lungs every 4 (four) hours as needed for wheezing or shortness of breath. 18 g 0   ibuprofen (ADVIL) 600 MG tablet Take 1 tablet (600 mg total) by mouth every 6 (six) hours as needed. 30 tablet 0   meclizine (ANTIVERT) 25 MG tablet Take 1 tablet (25 mg total) by mouth 3 (three) times daily as needed for dizziness. 30 tablet 0   No current facility-administered medications on file prior to visit.      Objective:     Vitals:   11/06/21 0853  BP: 132/82  Pulse: 64   Filed Weights   11/06/21 0853  Weight: 182 lb 8 oz (82.8 kg)              U/A = moderate blood          Assessment:    G5P0010 Patient Active Problem List   Diagnosis Date Noted   Breast mass, right 12/12/2014  1. Irregular menstrual bleeding   2. DUB (dysfunctional uterine bleeding)   3. Urinary tract infection symptoms   4. Hematuria, unspecified type     She has urinary symptoms and with moderate blood likely has a UTI.   Plan:            1.  Continue OCPs  2.  We will  presumptively treat with Macrobid-urine sent for culture. Orders No orders of the defined types were placed in this encounter.    Meds ordered this encounter  Medications   norethindrone-ethinyl estradiol-iron (LOESTRIN FE 1.5/30) 1.5-30 MG-MCG tablet    Sig: Take 1 tablet by mouth at bedtime.    Dispense:  84 tablet    Refill:  2   nitrofurantoin, macrocrystal-monohydrate, (MACROBID) 100 MG capsule    Sig: Take 1 capsule (100 mg total) by mouth 2 (two) times daily for 7 days.    Dispense:  14 capsule    Refill:  0      F/U  Return for Annual Physical. I spent 22 minutes involved in the care of this patient preparing to see the patient by obtaining and reviewing her medical history (including labs, imaging tests and prior procedures), documenting clinical information in the electronic health record (EHR), counseling and coordinating care plans, writing and sending prescriptions, ordering tests or procedures and in direct communicating with the patient and medical staff discussing pertinent items from her history and physical exam.  Elonda Husky, M.D. 11/06/2021 9:24 AM

## 2021-11-06 NOTE — Progress Notes (Signed)
Patient presents today for 3 month OCP follow-up. She states her cycles have regulated and her much shorter which she enjoys, still has one day of passing clots. Patient still has complaints of hot flashes and headaches. No additional concerns at this time.

## 2021-11-11 LAB — URINE CULTURE

## 2021-11-13 ENCOUNTER — Other Ambulatory Visit: Payer: Self-pay

## 2021-11-13 DIAGNOSIS — B379 Candidiasis, unspecified: Secondary | ICD-10-CM

## 2021-11-13 MED ORDER — FLUCONAZOLE 150 MG PO TABS: 150.0000 mg | ORAL_TABLET | Freq: Every day | ORAL | 0 refills | Status: DC

## 2021-12-02 ENCOUNTER — Ambulatory Visit: Payer: BC Managed Care – PPO | Admitting: Obstetrics and Gynecology

## 2021-12-31 ENCOUNTER — Ambulatory Visit: Payer: BC Managed Care – PPO | Admitting: Obstetrics and Gynecology

## 2021-12-31 DIAGNOSIS — Z124 Encounter for screening for malignant neoplasm of cervix: Secondary | ICD-10-CM

## 2021-12-31 DIAGNOSIS — Z1231 Encounter for screening mammogram for malignant neoplasm of breast: Secondary | ICD-10-CM

## 2021-12-31 DIAGNOSIS — Z01419 Encounter for gynecological examination (general) (routine) without abnormal findings: Secondary | ICD-10-CM

## 2022-02-17 ENCOUNTER — Encounter: Payer: Self-pay | Admitting: Obstetrics and Gynecology

## 2022-02-17 ENCOUNTER — Other Ambulatory Visit (HOSPITAL_COMMUNITY)
Admission: RE | Admit: 2022-02-17 | Discharge: 2022-02-17 | Disposition: A | Discharge status: Home / Self Care | Payer: BC Managed Care – PPO | Source: Ambulatory Visit | Attending: Obstetrics and Gynecology | Admitting: Obstetrics and Gynecology

## 2022-02-17 ENCOUNTER — Ambulatory Visit (INDEPENDENT_AMBULATORY_CARE_PROVIDER_SITE_OTHER): Payer: BC Managed Care – PPO | Admitting: Obstetrics and Gynecology

## 2022-02-17 VITALS — BP 158/90 | HR 73 | Ht 60.0 in | Wt 189.2 lb

## 2022-02-17 DIAGNOSIS — N938 Other specified abnormal uterine and vaginal bleeding: Secondary | ICD-10-CM

## 2022-02-17 DIAGNOSIS — Z124 Encounter for screening for malignant neoplasm of cervix: Secondary | ICD-10-CM | POA: Diagnosis present

## 2022-02-17 DIAGNOSIS — Z01419 Encounter for gynecological examination (general) (routine) without abnormal findings: Secondary | ICD-10-CM | POA: Insufficient documentation

## 2022-02-17 DIAGNOSIS — Z1231 Encounter for screening mammogram for malignant neoplasm of breast: Secondary | ICD-10-CM

## 2022-02-17 MED ORDER — NORETHIN ACE-ETH ESTRAD-FE 1.5-30 MG-MCG PO TABS: 1.0000 | ORAL_TABLET | Freq: Every day | ORAL | 3 refills | Status: DC

## 2022-02-17 NOTE — Progress Notes (Signed)
HPI:      [name] is a 52 y.o. G5P0010 who LMP was Patient's last menstrual period was 01/21/2022.  Subjective:   She presents today for her annual examination.  She has no complaints.  She continues to have normal regular cycles on OCPs.  She is much happier on OCPs because she only has 1 heavy day and she does not have significant cramping.    Hx: The following portions of the patient's history were reviewed and updated as appropriate:             She  has a past medical history of GERD (gastroesophageal reflux disease), Headache, and History of 2019 novel coronavirus disease (COVID-19) (01/11/2019). She does not have any pertinent problems on file. She  has a past surgical history that includes Dilation and curettage of uterus (2000); Tubal ligation (2006); laparoscopy (2009); and Ovary surgery (2009). Her family history includes Breast cancer (age of onset: 84) in her maternal grandmother; Uterine cancer (age of onset: 34) in her mother. She  reports that she has never smoked. She has never used smokeless tobacco. She reports that she does not currently use alcohol. She reports that she does not use drugs. She has a current medication list which includes the following prescription(s): albuterol, ibuprofen, meclizine, and norethindrone-ethinyl estradiol-iron. She has No Known Allergies.       Review of Systems:  Review of Systems  Constitutional: Denied constitutional symptoms, night sweats, recent illness, fatigue, fever, insomnia and weight loss.  Eyes: Denied eye symptoms, eye pain, photophobia, vision change and visual disturbance.  Ears/Nose/Throat/Neck: Denied ear, nose, throat or neck symptoms, hearing loss, nasal discharge, sinus congestion and sore throat.  Cardiovascular: Denied cardiovascular symptoms, arrhythmia, chest pain/pressure, edema, exercise intolerance, orthopnea and palpitations.  Respiratory: Denied pulmonary symptoms, asthma, pleuritic pain, productive  sputum, cough, dyspnea and wheezing.  Gastrointestinal: Denied, gastro-esophageal reflux, melena, nausea and vomiting.  Genitourinary: Denied genitourinary symptoms including symptomatic vaginal discharge, pelvic relaxation issues, and urinary complaints.  Musculoskeletal: Denied musculoskeletal symptoms, stiffness, swelling, muscle weakness and myalgia.  Dermatologic: Denied dermatology symptoms, rash and scar.  Neurologic: Denied neurology symptoms, dizziness, headache, neck pain and syncope.  Psychiatric: Denied psychiatric symptoms, anxiety and depression.  Endocrine: Denied endocrine symptoms including hot flashes and night sweats.   Meds:   Current Outpatient Medications on File Prior to Visit  Medication Sig Dispense Refill   albuterol (VENTOLIN HFA) 108 (90 Base) MCG/ACT inhaler Inhale 1-2 puffs into the lungs every 4 (four) hours as needed for wheezing or shortness of breath. 18 g 0   ibuprofen (ADVIL) 600 MG tablet Take 1 tablet (600 mg total) by mouth every 6 (six) hours as needed. 30 tablet 0   meclizine (ANTIVERT) 25 MG tablet Take 1 tablet (25 mg total) by mouth 3 (three) times daily as needed for dizziness. 30 tablet 0   No current facility-administered medications on file prior to visit.     Objective:     Vitals:   02/17/22 0810 02/17/22 0817  BP: (!) 140/83 (!) 158/90  Pulse: 73     Filed Weights   02/17/22 0810  Weight: 189 lb 3.2 oz (85.8 kg)              Physical examination General NAD, Conversant  HEENT Atraumatic; Op clear with mmm.  Normo-cephalic. Pupils reactive. Anicteric sclerae  Thyroid/Neck Smooth without nodularity or enlargement. Normal ROM.  Neck Supple.  Skin No rashes, lesions or ulceration. Normal palpated skin turgor. No  nodularity.  Breasts: No masses or discharge.  Symmetric.  No axillary adenopathy.  Lungs: Clear to auscultation.No rales or wheezes. Normal Respiratory effort, no retractions.  Heart: NSR.  No murmurs or rubs  appreciated. No peripheral edema  Abdomen: Soft.  Non-tender.  No masses.  No HSM. No hernia  Extremities: Moves all appropriately.  Normal ROM for age. No lymphadenopathy.  Neuro: Oriented to PPT.  Normal mood. Normal affect.     Pelvic:   Vulva: Normal appearance.  No lesions.  Vagina: No lesions or abnormalities noted.  Support: Normal pelvic support.  Urethra No masses tenderness or scarring.  Meatus Normal size without lesions or prolapse.  Cervix: Normal appearance.  No lesions.  Anus: Normal exam.  No lesions.  Perineum: Normal exam.  No lesions.        Bimanual   Uterus: Enlarged-14 weeks.  Non-tender.  Mobile.  AV.  Adnexae: No masses.  Non-tender to palpation.  Cul-de-sac: Negative for abnormality.     Assessment:    G5P0010 Patient Active Problem List   Diagnosis Date Noted   Breast mass, right 12/12/2014     1. Well woman exam with routine gynecological exam   2. Cervical cancer screening   3. Screening mammogram for breast cancer   4. DUB (dysfunctional uterine bleeding)     Patient doing well on OCPs-no more dysfunctional bleeding.     Plan:            1.  Basic Screening Recommendations The basic screening recommendations for asymptomatic women were discussed with the patient during her visit.  The age-appropriate recommendations were discussed with her and the rational for the tests reviewed.  When I am informed by the patient that another primary care physician has previously obtained the age-appropriate tests and they are up-to-date, only outstanding tests are ordered and referrals given as necessary.  Abnormal results of tests will be discussed with her when all of her results are completed.  Routine preventative health maintenance measures emphasized: Exercise/Diet/Weight control, Tobacco Warnings, Alcohol/Substance use risks and Stress Management Pap performed, mammogram ordered 2.  Continue OCPs  Will plan to test next year for menopause by getting an  [facility] after stopping OCPs for 2 weeks.  I have discussed this with her in some detail and she would like to continue OCPs until that time.  Orders Orders Placed This Encounter  Procedures   MM DIGITAL SCREENING BILATERAL     Meds ordered this encounter  Medications   norethindrone-ethinyl estradiol-iron (LOESTRIN FE 1.5/30) 1.5-30 MG-MCG tablet    Sig: Take 1 tablet by mouth at bedtime.    Dispense:  84 tablet    Refill:  3          F/U  Return in about 1 year (around 02/18/2023) for Annual Physical.  Finis Bud, M.D. 02/17/2022 8:50 AM

## 2022-02-17 NOTE — Progress Notes (Signed)
Patients presents for annual exam today. She states continuing to do well with current OCP, would like to continue. She is due for pap smear and mammogram, ordered. Annual labs are deferred to PCP.  She states no other questions or concerns at this time.

## 2022-02-19 LAB — CYTOLOGY - PAP
Comment: NEGATIVE
Diagnosis: NEGATIVE
High risk HPV: NEGATIVE

## 2022-12-16 ENCOUNTER — Ambulatory Visit
Admission: RE | Admit: 2022-12-16 | Discharge: 2022-12-16 | Disposition: A | Discharge status: Home / Self Care | Payer: BC Managed Care – PPO | Source: Ambulatory Visit | Attending: Internal Medicine | Admitting: Internal Medicine

## 2022-12-16 VITALS — BP 130/85 | HR 80 | Temp 99.1°F | Ht 60.0 in | Wt 192.0 lb

## 2022-12-16 DIAGNOSIS — H6993 Unspecified Eustachian tube disorder, bilateral: Secondary | ICD-10-CM

## 2022-12-16 DIAGNOSIS — R109 Unspecified abdominal pain: Secondary | ICD-10-CM

## 2022-12-16 DIAGNOSIS — R350 Frequency of micturition: Secondary | ICD-10-CM | POA: Diagnosis not present

## 2022-12-16 LAB — URINALYSIS, W/ REFLEX TO CULTURE (INFECTION SUSPECTED)
Bilirubin Urine: NEGATIVE
Glucose, UA: NEGATIVE mg/dL
Ketones, ur: NEGATIVE mg/dL
Leukocytes,Ua: NEGATIVE
Nitrite: NEGATIVE
Protein, ur: NEGATIVE mg/dL
Specific Gravity, Urine: >1.03 — ABNORMAL HIGH (ref 1.005–1.030)
pH: 5.5 (ref 5.0–8.0)

## 2022-12-16 MED ORDER — CEFTRIAXONE SODIUM 1 G IJ SOLR
1.0000 g | Freq: Once | INTRAMUSCULAR | Status: AC
  Administered 2022-12-16: 1 g via INTRAMUSCULAR

## 2022-12-16 MED ORDER — FLUTICASONE PROPIONATE 50 MCG/ACT NA SUSP: 1.0000 | Freq: Every day | NASAL | 0 refills | Status: AC

## 2022-12-16 MED ORDER — SULFAMETHOXAZOLE-TRIMETHOPRIM 800-160 MG PO TABS: 1.0000 | ORAL_TABLET | Freq: Two times a day (BID) | ORAL | 0 refills | Status: AC

## 2022-12-16 NOTE — Discharge Instructions (Addendum)
Start Flonase daily.  He may use over-the-counter decongestant such as Sudafed.  The clinic will contact you with results of the urine culture done today if positive.  Start Bactrim twice daily for 7 days.  Lots of rest and fluids.  Please follow-up with your PCP if your symptoms do not improve.  Please go to the ER for any worsening symptoms.  I hope you feel better soon!

## 2022-12-16 NOTE — ED Triage Notes (Signed)
Pt c/o bilateral ear drainage x10 day & flank pain w/frequency x1 wk. Denies any hematuria.

## 2022-12-16 NOTE — ED Provider Notes (Signed)
[facility]    CSN: [id number] Arrival date & time: 12/16/22  1132      History   Chief Complaint Chief Complaint  Patient presents with   Ear Drainage   Back Pain    HPI Jenny Luna is a 52 y.o. female presents for evaluation of ear pain and urinary symptoms.  Patient reports 10 days of bilateral ear itching with right ear drainage for about 10 days.  Does endorse some tinnitus but denies any fevers, URI symptoms.  No change in hearing.  In addition she reports 1 week of urinary frequency with malodorous urine.  Denies any nausea/vomiting, burning with urination, urgency.  Does endorse some back pain as well.  Again no fevers.  Has a history of recurrent UTIs.  No OTC medications have been used since onset.  No other concerns at this time.   Ear Drainage  Back Pain   Past Medical History:  Diagnosis Date   GERD (gastroesophageal reflux disease)    Headache    History of 2019 novel coronavirus disease (COVID-19) 01/11/2019    Patient Active Problem List   Diagnosis Date Noted   Breast mass, right 12/12/2014    Past Surgical History:  Procedure Laterality Date   DILATION AND CURETTAGE OF UTERUS  2000   LAPAROSCOPY  2009   OVARY SURGERY  2009   Cyst   TUBAL LIGATION  2006    OB History     Gravida  5   Para  4   Term      Preterm      AB  1   Living         SAB  1   IAB      Ectopic      Multiple      Live Births           Obstetric Comments  1st Menstrual Cycle:  12  1st Pregnancy:  20           Home Medications    Prior to Admission medications   Medication Sig Start Date End Date Taking? Authorizing Provider  fluticasone (FLONASE) 50 MCG/ACT nasal spray Place 1 spray into both nostrils daily. 12/16/22  Yes Radford Pax, NP  norethindrone-ethinyl estradiol-iron (LOESTRIN FE 1.5/30) 1.5-30 MG-MCG tablet Take 1 tablet by mouth at bedtime. 02/17/22 12/16/22 Yes Linzie Collin, MD   sulfamethoxazole-trimethoprim (BACTRIM DS) 800-160 MG tablet Take 1 tablet by mouth 2 (two) times daily for 7 days. 12/16/22 12/23/22 Yes Radford Pax, NP  albuterol (VENTOLIN HFA) 108 (90 Base) MCG/ACT inhaler Inhale 1-2 puffs into the lungs every 4 (four) hours as needed for wheezing or shortness of breath. 01/11/19   Domenick Gong, MD  ibuprofen (ADVIL) 600 MG tablet Take 1 tablet (600 mg total) by mouth every 6 (six) hours as needed. 01/11/19   Domenick Gong, MD  meclizine (ANTIVERT) 25 MG tablet Take 1 tablet (25 mg total) by mouth 3 (three) times daily as needed for dizziness. 04/20/19   Verlee Monte, NP    Family History Family History  Problem Relation Age of Onset   Uterine cancer Mother 58   Breast cancer Maternal Grandmother 82    Social History Social History   Tobacco Use   Smoking status: Never   Smokeless tobacco: Never  Vaping Use   Vaping status: Never Used  Substance Use Topics   Alcohol use: Not Currently    Comment: occasionally   Drug use: No  Allergies   Patient has no known allergies.   Review of Systems Review of Systems  HENT:  Positive for ear pain.   Genitourinary:  Positive for frequency.  Musculoskeletal:  Positive for back pain.     Physical Exam Triage Vital Signs ED Triage Vitals  Encounter Vitals Group     BP 12/16/22 1142 130/85     Systolic BP Percentile --      Diastolic BP Percentile --      Pulse Rate 12/16/22 1142 80     Resp --      Temp 12/16/22 1142 99.1 F (37.3 C)     Temp Source 12/16/22 1142 Oral     SpO2 12/16/22 1142 95 %     Weight 12/16/22 1142 192 lb (87.1 kg)     Height 12/16/22 1142 5' (1.524 m)     Head Circumference --      Peak Flow --      Pain Score 12/16/22 1146 7     Pain Loc --      Pain Education --      Exclude from Growth Chart --    No data found.  Updated Vital Signs BP 130/85 (BP Location: Left Arm)   Pulse 80   Temp 99.1 F (37.3 C) (Oral)   Ht 5' (1.524 m)   Wt 192 lb  (87.1 kg)   LMP 11/25/2022 (Approximate)   SpO2 95%   BMI 37.50 kg/m   Visual Acuity Right Eye Distance:   Left Eye Distance:   Bilateral Distance:    Right Eye Near:   Left Eye Near:    Bilateral Near:     Physical Exam Vitals and nursing note reviewed.  Constitutional:      General: She is not in acute distress.    Appearance: Normal appearance. She is not ill-appearing.  HENT:     Head: Normocephalic and atraumatic.     Right Ear: Ear canal normal. No drainage or swelling. A middle ear effusion is present. There is no impacted cerumen. No mastoid tenderness. Tympanic membrane is not erythematous.     Left Ear: Ear canal normal. No drainage or swelling. A middle ear effusion is present. There is no impacted cerumen. No mastoid tenderness. Tympanic membrane is not erythematous.  Eyes:     Pupils: Pupils are equal, round, and reactive to light.  Cardiovascular:     Rate and Rhythm: Normal rate.  Pulmonary:     Effort: Pulmonary effort is normal.  Abdominal:     Tenderness: There is left CVA tenderness. There is no right CVA tenderness.  Skin:    General: Skin is warm and dry.  Neurological:     General: No focal deficit present.     Mental Status: She is alert and oriented to person, place, and time.  Psychiatric:        Mood and Affect: Mood normal.        Behavior: Behavior normal.      UC Treatments / Results  Labs (all labs ordered are listed, but only abnormal results are displayed) Labs Reviewed  URINALYSIS, W/ REFLEX TO CULTURE (INFECTION SUSPECTED) - Abnormal; Notable for the following components:      Result Value   APPearance HAZY (*)    Specific Gravity, Urine >1.030 (*)    Hgb urine dipstick TRACE (*)    Bacteria, UA MANY (*)    All other components within normal limits  URINE CULTURE    EKG  Radiology No results found.  Procedures Procedures (including critical care time)  Medications Ordered in UC Medications  cefTRIAXone (ROCEPHIN)  injection 1 g (has no administration in time range)    Initial Impression / Assessment and Plan / UC Course  I have reviewed the triage vital signs and the nursing notes.  Pertinent labs & imaging results that were available during my care of the patient were reviewed by me and considered in my medical decision making (see chart for details).     Reviewed exam and symptoms with patient.  Discussed eustachian tube dysfunction.  Trial of Flonase and OTC decongestants.  UA with many bacteria and trace blood, will culture.  Given low-grade fever in clinic, symptoms and flank pain will cover for potential pyelonephritis.  Patient given injection of ceftriaxone in clinic.  She was monitored for 10 minutes after injection with no reaction noted and tolerated well.  Will start Bactrim for 7 days.  Lots of rest and fluids.  PCP follow-up as symptoms do not improve.  ER precautions reviewed. Final Clinical Impressions(s) / UC Diagnoses   Final diagnoses:  Eustachian tube dysfunction, bilateral  Urinary frequency  Flank pain     Discharge Instructions      Start Flonase daily.  He may use over-the-counter decongestant such as Sudafed.  The clinic will contact you with results of the urine culture done today if positive.  Start Bactrim twice daily for 7 days.  Lots of rest and fluids.  Please follow-up with your PCP if your symptoms do not improve.  Please go to the ER for any worsening symptoms.  I hope you feel better soon!     ED Prescriptions     Medication Sig Dispense Auth. Provider   fluticasone (FLONASE) 50 MCG/ACT nasal spray Place 1 spray into both nostrils daily. 15.8 mL Radford Pax, NP   sulfamethoxazole-trimethoprim (BACTRIM DS) 800-160 MG tablet Take 1 tablet by mouth 2 (two) times daily for 7 days. 14 tablet Radford Pax, NP      PDMP not reviewed this encounter.   Radford Pax, NP 12/16/22 1227

## 2022-12-18 LAB — URINE CULTURE: Culture: >=100000 — AB

## 2023-01-18 ENCOUNTER — Other Ambulatory Visit: Payer: Self-pay

## 2023-01-18 DIAGNOSIS — N938 Other specified abnormal uterine and vaginal bleeding: Secondary | ICD-10-CM

## 2023-01-18 MED ORDER — NORETHIN ACE-ETH ESTRAD-FE 1.5-30 MG-MCG PO TABS: 1.0000 | ORAL_TABLET | Freq: Every day | ORAL | 0 refills | Status: DC

## 2023-01-28 ENCOUNTER — Ambulatory Visit
Admission: RE | Admit: 2023-01-28 | Discharge: 2023-01-28 | Disposition: A | Discharge status: Home / Self Care | Payer: BC Managed Care – PPO | Source: Ambulatory Visit | Attending: Emergency Medicine | Admitting: Emergency Medicine

## 2023-01-28 ENCOUNTER — Ambulatory Visit: Payer: Self-pay

## 2023-01-28 VITALS — BP 156/94 | HR 93 | Temp 98.1°F | Ht 59.0 in | Wt 194.0 lb

## 2023-01-28 DIAGNOSIS — J069 Acute upper respiratory infection, unspecified: Secondary | ICD-10-CM | POA: Diagnosis not present

## 2023-01-28 DIAGNOSIS — M5431 Sciatica, right side: Secondary | ICD-10-CM | POA: Diagnosis not present

## 2023-01-28 DIAGNOSIS — H66002 Acute suppurative otitis media without spontaneous rupture of ear drum, left ear: Secondary | ICD-10-CM

## 2023-01-28 LAB — SARS CORONAVIRUS 2 BY RT PCR: SARS Coronavirus 2 by RT PCR: POSITIVE — AB

## 2023-01-28 MED ORDER — AMOXICILLIN-POT CLAVULANATE 875-125 MG PO TABS: 1.0000 | ORAL_TABLET | Freq: Two times a day (BID) | ORAL | 0 refills | Status: DC

## 2023-01-28 NOTE — ED Provider Notes (Signed)
[facility]    CSN: [id number] Arrival date & time: 01/28/23  1800      History   Chief Complaint Chief Complaint  Patient presents with   Facial Pain    HPI Jenny Luna is a 53 y.o. female.   53 year old female, Jenny Luna, presents to urgent care for evaluation of facial pain,cough,sneezing for several days, was sick recently with URI type symptoms and got better but symptoms returned 3 days ago. 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 Pt denies smoking,drinking or alcohol consumption. Pt states when she coughs it aggravates her right sided sciatic nerve. Taking OTC meds for symptom management, no loss of bowel and bladder,no saddle numbness.  The history is provided by the patient. No language interpreter was used.    Past Medical History:  Diagnosis Date   GERD (gastroesophageal reflux disease)    Headache    History of 2019 novel coronavirus disease (COVID-19) 01/11/2019    Patient Active Problem List   Diagnosis Date Noted   Acute suppurative otitis media of left ear without spontaneous rupture of tympanic membrane 01/28/2023   Acute URI 01/28/2023   Right sciatic nerve pain 01/28/2023   Breast mass, right 12/12/2014    Past Surgical History:  Procedure Laterality Date   DILATION AND CURETTAGE OF UTERUS  2000   LAPAROSCOPY  2009   OVARY SURGERY  2009   Cyst   TUBAL LIGATION  2006    OB History     Gravida  5   Para  4   Term      Preterm      AB  1   Living         SAB  1   IAB      Ectopic      Multiple      Live Births           Obstetric Comments  1st Menstrual Cycle:  12  1st Pregnancy:  20           Home Medications    Prior to Admission medications   Medication Sig Start Date End Date Taking? Authorizing Provider  albuterol (VENTOLIN HFA) 108 (90 Base) MCG/ACT inhaler Inhale 1-2 puffs into the lungs every 4 (four) hours as needed for wheezing or shortness of breath.  01/11/19  Yes Domenick Gong, MD  amoxicillin-clavulanate (AUGMENTIN) 875-125 MG tablet Take 1 tablet by mouth every 12 (twelve) hours. 01/28/23  Yes Nyjah Schwake, Para March, NP  fluticasone (FLONASE) 50 MCG/ACT nasal spray Place 1 spray into both nostrils daily. 12/16/22  Yes Radford Pax, NP  ibuprofen (ADVIL) 600 MG tablet Take 1 tablet (600 mg total) by mouth every 6 (six) hours as needed. 01/11/19  Yes Domenick Gong, MD  norethindrone-ethinyl estradiol-iron (LOESTRIN FE 1.5/30) 1.5-30 MG-MCG tablet Take 1 tablet by mouth at bedtime. 01/18/23 07/17/23 Yes Linzie Collin, MD  meclizine (ANTIVERT) 25 MG tablet Take 1 tablet (25 mg total) by mouth 3 (three) times daily as needed for dizziness. 04/20/19   Verlee Monte, NP    Family History Family History  Problem Relation Age of Onset   Uterine cancer Mother 32   Breast cancer Maternal Grandmother 63    Social History Social History   Tobacco Use   Smoking status: Never   Smokeless tobacco: Never  Vaping Use   Vaping status: Never Used  Substance Use Topics   Alcohol use: Not Currently    Comment:  occasionally   Drug use: No     Allergies   Patient has no known allergies.   Review of Systems Review of Systems  Constitutional:  Negative for fever.  HENT:  Positive for congestion, ear pain, postnasal drip, sinus pressure, sinus pain and sneezing.   Musculoskeletal:  Positive for back pain and myalgias.  All other systems reviewed and are negative.    Physical Exam Triage Vital Signs ED Triage Vitals  Encounter Vitals Group     BP      Systolic BP Percentile      Diastolic BP Percentile      Pulse      Resp      Temp      Temp src      SpO2      Weight      Height      Head Circumference      Peak Flow      Pain Score      Pain Loc      Pain Education      Exclude from Growth Chart    No data found.  Updated Vital Signs BP (!) 156/94 (BP Location: Left Arm)   Pulse 93   Temp 98.1 F (36.7 C) (Oral)    Ht 4\' 11"  (1.499 m)   Wt 194 lb (88 kg)   LMP 01/24/2023   SpO2 96%   BMI 39.18 kg/m   Visual Acuity Right Eye Distance:   Left Eye Distance:   Bilateral Distance:    Right Eye Near:   Left Eye Near:    Bilateral Near:     Physical Exam Vitals and nursing note reviewed.  HENT:     Head: Normocephalic.     Right Ear: Tympanic membrane is erythematous and bulging.     Left Ear: Tympanic membrane is retracted.     Nose: Mucosal edema and congestion present.     Mouth/Throat:     Lips: Pink.     Mouth: Mucous membranes are moist.     Pharynx: Uvula midline. Posterior oropharyngeal erythema present.     Tonsils: No tonsillar exudate or tonsillar abscesses.  Cardiovascular:     Rate and Rhythm: Normal rate and regular rhythm.     Pulses: Normal pulses.     Heart sounds: Normal heart sounds.  Pulmonary:     Effort: Pulmonary effort is normal.     Breath sounds: Normal breath sounds and air entry.  Musculoskeletal:     Lumbar back: Spasms and tenderness present. Negative right straight leg raise test and negative left straight leg raise test.  Neurological:     General: No focal deficit present.     Mental Status: She is alert and oriented to person, place, and time.     GCS: GCS eye subscore is 4. GCS verbal subscore is 5. GCS motor subscore is 6.     Cranial Nerves: No cranial nerve deficit.     Sensory: No sensory deficit.  Psychiatric:        Attention and Perception: Attention normal.        Mood and Affect: Mood normal.        Speech: Speech normal.      UC Treatments / Results  Labs (all labs ordered are listed, but only abnormal results are displayed) Labs Reviewed  SARS CORONAVIRUS 2 BY RT PCR    EKG   Radiology No results found.  Procedures Procedures (including critical care time)  Medications  Ordered in UC Medications - No data to display  Initial Impression / Assessment and Plan / UC Course  I have reviewed the triage vital signs and the  nursing notes.  Pertinent labs & imaging results that were available during my care of the patient were reviewed by me and considered in my medical decision making (see chart for details).    Discussed exam findings and plan of care with patient, strict go to ER precautions given.   Patient verbalized understanding to this provider. Ddx: Acute URI, AOM, sciatic nerve pain(right) Final Clinical Impressions(s) / UC Diagnoses   Final diagnoses:  Acute suppurative otitis media of left ear without spontaneous rupture of tympanic membrane, recurrence not specified  Acute URI  Right sciatic nerve pain     Discharge Instructions      Take Augmentin as directed. Push fluids. May alternate tylenol/ibuprofen as label directed. May use lidocaine patches(over the counter for back pain). If you develop loss of function,loss of bowel and bladder, saddle numbness or worsening back pain go to ER for further evaluation,      ED Prescriptions     Medication Sig Dispense Auth. Provider   amoxicillin-clavulanate (AUGMENTIN) 875-125 MG tablet Take 1 tablet by mouth every 12 (twelve) hours. 14 tablet Jeanann Balinski, Para March, NP      PDMP not reviewed this encounter.   Clancy Gourd, NP 01/28/23 [id number]

## 2023-01-28 NOTE — Discharge Instructions (Addendum)
Take Augmentin as directed. Push fluids. May alternate tylenol/ibuprofen as label directed. May use lidocaine patches(over the counter for back pain). If you develop loss of function,loss of bowel and bladder, saddle numbness or worsening back pain go to ER for further evaluation,

## 2023-01-28 NOTE — ED Triage Notes (Signed)
Pt c/o sinus pressure, facial pain, nasal congestion, eye drainage, cough, sneezing, sciatic nerve pain on the right side from coughing x3days  Pt states that she felt woozy and had to force herself to breathe last night with back and right side flank pain.

## 2023-02-23 ENCOUNTER — Encounter: Payer: Self-pay | Admitting: Obstetrics and Gynecology

## 2023-02-23 ENCOUNTER — Ambulatory Visit: Payer: BC Managed Care – PPO | Admitting: Obstetrics and Gynecology

## 2023-02-23 VITALS — BP 134/82 | HR 76 | Ht 59.0 in | Wt 196.1 lb

## 2023-02-23 DIAGNOSIS — Z01411 Encounter for gynecological examination (general) (routine) with abnormal findings: Secondary | ICD-10-CM

## 2023-02-23 DIAGNOSIS — R350 Frequency of micturition: Secondary | ICD-10-CM | POA: Diagnosis not present

## 2023-02-23 DIAGNOSIS — Z1211 Encounter for screening for malignant neoplasm of colon: Secondary | ICD-10-CM

## 2023-02-23 DIAGNOSIS — N951 Menopausal and female climacteric states: Secondary | ICD-10-CM | POA: Diagnosis not present

## 2023-02-23 DIAGNOSIS — R300 Dysuria: Secondary | ICD-10-CM

## 2023-02-23 DIAGNOSIS — Z1231 Encounter for screening mammogram for malignant neoplasm of breast: Secondary | ICD-10-CM

## 2023-02-23 DIAGNOSIS — Z01419 Encounter for gynecological examination (general) (routine) without abnormal findings: Secondary | ICD-10-CM

## 2023-02-23 LAB — POCT URINALYSIS DIPSTICK
Bilirubin, UA: NEGATIVE
Glucose, UA: NEGATIVE
Ketones, UA: NEGATIVE
Nitrite, UA: NEGATIVE
Protein, UA: NEGATIVE
Spec Grav, UA: 1.025 (ref 1.010–1.025)
Urobilinogen, UA: 0.2 U/dL
pH, UA: 5 (ref 5.0–8.0)

## 2023-02-23 MED ORDER — NITROFURANTOIN MONOHYD MACRO 100 MG PO CAPS: 100.0000 mg | ORAL_CAPSULE | Freq: Two times a day (BID) | ORAL | 0 refills | Status: DC

## 2023-02-23 NOTE — Progress Notes (Signed)
 Patients presents for annual exam today. She states she stopped her OCP two weeks ago and has been experiencing menopausal symptoms. Up to date on pap smear. Due for mammogram, ordered. Annual labs are ordered with [facility]. Cologuard ordered. Concerns of UTI, UA preformed and culture sent.

## 2023-02-23 NOTE — Progress Notes (Signed)
 HPI:      Ms. ARRIYANNA MERSCH is a 53 y.o. G5P0010 who LMP was Patient's last menstrual period was 01/24/2023.  Subjective:   She presents today for her annual examination.  She states that over the last several days she has had some burning with urination with some urinary frequency.  She thinks she might have a bladder infection. She has been taking OCPs and has been wondering if she is in menopause.  She was asked to stop the OCPs for 2 weeks before her visit so that we can draw an [facility].  Since that time she has had hot flashes and signs and symptoms of menopause.  She will have a FSH drawn today.    Hx: The following portions of the patient's history were reviewed and updated as appropriate:             She  has a past medical history of GERD (gastroesophageal reflux disease), Headache, and History of 2019 novel coronavirus disease (COVID-19) (01/11/2019). She does not have any pertinent problems on file. She  has a past surgical history that includes Dilation and curettage of uterus (2000); Tubal ligation (2006); laparoscopy (2009); and Ovary surgery (2009). Her family history includes Breast cancer (age of onset: 58) in her maternal grandmother; Uterine cancer (age of onset: 21) in her mother. She  reports that she has never smoked. She has never used smokeless tobacco. She reports that she does not currently use alcohol. She reports that she does not use drugs. She has a current medication list which includes the following prescription(s): albuterol, fluticasone, ibuprofen, meclizine, and norethindrone-ethinyl estradiol-iron. She has no known allergies.       Review of Systems:  Review of Systems  Constitutional: Denied constitutional symptoms, night sweats, recent illness, fatigue, fever, insomnia and weight loss.  Eyes: Denied eye symptoms, eye pain, photophobia, vision change and visual disturbance.  Ears/Nose/Throat/Neck: Denied ear, nose, throat or neck symptoms, hearing loss,  nasal discharge, sinus congestion and sore throat.  Cardiovascular: Denied cardiovascular symptoms, arrhythmia, chest pain/pressure, edema, exercise intolerance, orthopnea and palpitations.  Respiratory: Denied pulmonary symptoms, asthma, pleuritic pain, productive sputum, cough, dyspnea and wheezing.  Gastrointestinal: Denied, gastro-esophageal reflux, melena, nausea and vomiting.  Genitourinary: Denied genitourinary symptoms including symptomatic vaginal discharge, pelvic relaxation issues, and urinary complaints.  Musculoskeletal: Denied musculoskeletal symptoms, stiffness, swelling, muscle weakness and myalgia.  Dermatologic: Denied dermatology symptoms, rash and scar.  Neurologic: Denied neurology symptoms, dizziness, headache, neck pain and syncope.  Psychiatric: Denied psychiatric symptoms, anxiety and depression.  Endocrine: Denied endocrine symptoms including hot flashes and night sweats.   Meds:   Current Outpatient Medications on File Prior to Visit  Medication Sig Dispense Refill   albuterol (VENTOLIN HFA) 108 (90 Base) MCG/ACT inhaler Inhale 1-2 puffs into the lungs every 4 (four) hours as needed for wheezing or shortness of breath. 18 g 0   fluticasone (FLONASE) 50 MCG/ACT nasal spray Place 1 spray into both nostrils daily. 15.8 mL 0   ibuprofen (ADVIL) 600 MG tablet Take 1 tablet (600 mg total) by mouth every 6 (six) hours as needed. 30 tablet 0   meclizine (ANTIVERT) 25 MG tablet Take 1 tablet (25 mg total) by mouth 3 (three) times daily as needed for dizziness. 30 tablet 0   norethindrone-ethinyl estradiol-iron (LOESTRIN FE 1.5/30) 1.5-30 MG-MCG tablet Take 1 tablet by mouth at bedtime. 30 tablet 0   No current facility-administered medications on file prior to visit.     Objective:  Vitals:   02/23/23 1018  BP: 134/82  Pulse: 76    Filed Weights   02/23/23 1018  Weight: 196 lb 1.6 oz (89 kg)              Physical examination General NAD, Conversant  HEENT  Atraumatic; Op clear with mmm.  Normo-cephalic.  Anicteric sclerae  Thyroid/Neck Smooth without nodularity or enlargement. Normal ROM.  Neck Supple.  Skin No rashes, lesions or ulceration. Normal palpated skin turgor. No nodularity.  Breasts: No masses or discharge.  Symmetric.  No axillary adenopathy.  Lungs: Clear to auscultation.No rales or wheezes. Normal Respiratory effort, no retractions.  Heart: NSR.  No murmurs or rubs appreciated. No peripheral edema  Abdomen: Soft.  Non-tender.  No masses.  No HSM. No hernia  Extremities: Moves all appropriately.  Normal ROM for age. No lymphadenopathy.  Neuro: Oriented to PPT.  Normal mood. Normal affect.     Pelvic: Deferred by patient    Assessment:    G5P0010 Patient Active Problem List   Diagnosis Date Noted   Acute suppurative otitis media of left ear without spontaneous rupture of tympanic membrane 01/28/2023   Acute URI 01/28/2023   Right sciatic nerve pain 01/28/2023   Breast mass, right 12/12/2014     1. Well woman exam with routine gynecological exam   2. Screening mammogram for breast cancer   3. Screen for colon cancer   4. Menopausal symptom   5. Dysuria   6. Urinary frequency     Likely UTI based on dip UA and symptoms.   Plan:            1.  Basic Screening Recommendations The basic screening recommendations for asymptomatic women were discussed with the patient during her visit.  The age-appropriate recommendations were discussed with her and the rational for the tests reviewed.  When I am informed by the patient that another primary care physician has previously obtained the age-appropriate tests and they are up-to-date, only outstanding tests are ordered and referrals given as necessary.  Abnormal results of tests will be discussed with her when all of her results are completed.  Routine preventative health maintenance measures emphasized: Exercise/Diet/Weight control, Tobacco Warnings, Alcohol/Substance use risks  and Stress Management Mammogram ordered-Cologuard ordered Blood work 2.  Macrobid for suspected UTI-urine for culture and sensitivity.  Patient to hydrate and use AZO as necessary. 3.  FSH today to rule out menopause -if elevated we will discuss HRT by video visit.  Otherwise we will restart OCPs.  Orders Orders Placed This Encounter  Procedures   Urine Culture   MM DIGITAL SCREENING BILATERAL   Basic metabolic panel   CBC   Cholesterol, total   Hemoglobin A1c   Lipid panel   Cologuard   FSH   POCT Urinalysis Dipstick    No orders of the defined types were placed in this encounter.       F/U  Return in about 1 year (around 02/23/2024) for Annual Physical, We will contact her with any abnormal test results.  Elonda Husky, M.D. 02/23/2023 11:05 AM

## 2023-02-24 LAB — BASIC METABOLIC PANEL
BUN/Creatinine Ratio: 14 (ref 9–23)
BUN: 11 mg/dL (ref 6–24)
CO2: 22 mmol/L (ref 20–29)
Calcium: 9.5 mg/dL (ref 8.7–10.2)
Chloride: 103 mmol/L (ref 96–106)
Creatinine, Ser: 0.79 mg/dL (ref 0.57–1.00)
Glucose: 85 mg/dL (ref 70–99)
Potassium: 4.2 mmol/L (ref 3.5–5.2)
Sodium: 140 mmol/L (ref 134–144)
eGFR: 89 mL/min/{1.73_m2} (ref >59)

## 2023-02-24 LAB — FOLLICLE STIMULATING HORMONE: FSH: 48.3 m[IU]/mL

## 2023-02-24 LAB — CBC
Hematocrit: 40.1 % (ref 34.0–46.6)
Hemoglobin: 13.7 g/dL (ref 11.1–15.9)
MCH: 30.2 pg (ref 26.6–33.0)
MCHC: 34.2 g/dL (ref 31.5–35.7)
MCV: 88 fL (ref 79–97)
Platelets: 361 10*3/uL (ref 150–450)
RBC: 4.54 x10E6/uL (ref 3.77–5.28)
RDW: 12.3 % (ref 11.7–15.4)
WBC: 8.5 10*3/uL (ref 3.4–10.8)

## 2023-02-24 LAB — LIPID PANEL
Chol/HDL Ratio: 5.4 {ratio} — ABNORMAL HIGH (ref 0.0–4.4)
Cholesterol, Total: 239 mg/dL — ABNORMAL HIGH (ref 100–199)
HDL: 44 mg/dL (ref >39)
LDL Chol Calc (NIH): 173 mg/dL — ABNORMAL HIGH (ref 0–99)
Triglycerides: 120 mg/dL (ref 0–149)
VLDL Cholesterol Cal: 22 mg/dL (ref 5–40)

## 2023-02-24 LAB — HEMOGLOBIN A1C
Est. average glucose Bld gHb Est-mCnc: 111 mg/dL
Hgb A1c MFr Bld: 5.5 % (ref 4.8–5.6)

## 2023-02-26 LAB — URINE CULTURE

## 2023-03-01 ENCOUNTER — Ambulatory Visit
Admission: RE | Admit: 2023-03-01 | Discharge: 2023-03-01 | Disposition: A | Discharge status: Home / Self Care | Payer: Self-pay | Source: Ambulatory Visit | Attending: Emergency Medicine | Admitting: Emergency Medicine

## 2023-03-01 VITALS — BP 150/87 | HR 69 | Temp 98.2°F | Resp 18

## 2023-03-01 DIAGNOSIS — M5432 Sciatica, left side: Secondary | ICD-10-CM

## 2023-03-01 MED ORDER — NAPROXEN 500 MG PO TABS: 500.0000 mg | ORAL_TABLET | Freq: Two times a day (BID) | ORAL | 0 refills | Status: AC

## 2023-03-01 MED ORDER — PREDNISONE 10 MG (21) PO TBPK: ORAL_TABLET | ORAL | 0 refills | Status: AC

## 2023-03-01 NOTE — ED Triage Notes (Signed)
 Patient presents with c/o left sided lower back with pain radiating down her leg x 3 days. Patient states she is taking ibuprofen for the pain.

## 2023-03-01 NOTE — ED Provider Notes (Signed)
 HPI  SUBJECTIVE:  Jenny Luna is a 53 y.o. female who presents with intermittent, hours long, sharp, shooting left sciatic pain starting in her buttock, radiating down her posterior leg that became worse yesterday.  Unsure as to what triggered it off.  No fevers, leg weakness, trauma to the area, recent heavy lifting, rash in the area of pain, saddle anesthesia, urinary fecal incontinence, urinary retention.  She has had symptoms like this before.  She has been taking ibuprofen 800 mg every 4 hours with some improvement in her symptoms, last dose was within 6 hours of evaluation.  She has also tried some home PT exercises.  Symptoms are better with walking, worse with bending forward and sitting for prolonged periods of time.  She is a past medical history of left sciatic pain and is status post bilateral tubal ligation.  No history of diabetes, chronic kidney disease.  LMP: 2/2.  Denies the possibility of being pregnant.  PCP: Primary care physicians in [place]  Pt states that previous flareups of sciatica have been treated successfully with prednisone.  Past Medical History:  Diagnosis Date   GERD (gastroesophageal reflux disease)    Headache    History of 2019 novel coronavirus disease (COVID-19) 01/11/2019    Past Surgical History:  Procedure Laterality Date   DILATION AND CURETTAGE OF UTERUS  2000   LAPAROSCOPY  2009   OVARY SURGERY  2009   Cyst   TUBAL LIGATION  2006    Family History  Problem Relation Age of Onset   Uterine cancer Mother 74   Breast cancer Maternal Grandmother 13    Social History   Tobacco Use   Smoking status: Never   Smokeless tobacco: Never  Vaping Use   Vaping status: Never Used  Substance Use Topics   Alcohol use: Not Currently    Comment: occasionally   Drug use: No    No current facility-administered medications for this encounter.  Current Outpatient Medications:    naproxen (NAPROSYN) 500 MG tablet, Take 1 tablet (500 mg total)  by mouth 2 (two) times daily., Disp: 20 tablet, Rfl: 0   predniSONE (STERAPRED UNI-PAK 21 TAB) 10 MG (21) TBPK tablet, Dispense one 6 day pack. Take as directed with food., Disp: 21 tablet, Rfl: 0   albuterol (VENTOLIN HFA) 108 (90 Base) MCG/ACT inhaler, Inhale 1-2 puffs into the lungs every 4 (four) hours as needed for wheezing or shortness of breath., Disp: 18 g, Rfl: 0   fluticasone (FLONASE) 50 MCG/ACT nasal spray, Place 1 spray into both nostrils daily., Disp: 15.8 mL, Rfl: 0   meclizine (ANTIVERT) 25 MG tablet, Take 1 tablet (25 mg total) by mouth 3 (three) times daily as needed for dizziness., Disp: 30 tablet, Rfl: 0   norethindrone-ethinyl estradiol-iron (LOESTRIN FE 1.5/30) 1.5-30 MG-MCG tablet, Take 1 tablet by mouth at bedtime., Disp: 30 tablet, Rfl: 0   pravastatin (PRAVACHOL) 40 MG tablet, Take 40 mg by mouth daily., Disp: , Rfl:   No Known Allergies   ROS  As noted in HPI.   Physical Exam  BP (!) 150/87 (BP Location: Left Arm)   Pulse 69   Temp 98.2 F (36.8 C) (Oral)   Resp 18   LMP 02/07/2023   SpO2 95%   Constitutional: Well developed, well nourished, no acute distress Eyes:  EOMI, conjunctiva normal bilaterally HENT: Normocephalic, atraumatic,mucus membranes moist Respiratory: Normal inspiratory effort Cardiovascular: Normal rate GI: nondistended. No suprapubic tenderness skin: No rash, skin intact Musculoskeletal: No paralumbar  tenderness, no muscle spasm. No L-spine, bony tenderness.  Tenderness of the left sciatic notch and down posterior left leg.  Bilateral lower extremities nontender, baseline ROM with intact DP pulses.  Sensation between thighs intact.  Pain with active left hip flexion/extension against resistance.  No pain with passive int/ext rotation,  AD/ABduction bilaterally. SLR positive left side. Sensation baseline light touch bilaterally for Pt, DTR's symmetric and intact bilaterally KJ, Motor symmetric bilateral 5/5 hip flexion, quadriceps,  hamstrings, EHL, foot dorsiflexion, foot plantarflexion, gait somewhat antalgic but without apparent new ataxia. Neurologic: Alert & oriented x 3, no focal neuro deficits Psychiatric: Speech and behavior appropriate   ED Course   Medications - No data to display  No orders of the defined types were placed in this encounter.   No results found for this or any previous visit (from the past 24 hours). No results found.  ED Clinical Impression  1. Sciatica of left side    ED Assessment/Plan     Patient presents with a flare of sciatica.  She states this is typical of her usual pain.  She denies back pain.  No evidence of spinal cord involvement based on H&P. Pt describing typical back pain, has been < 6 week duration. No historical red flags as noted in HPI. No physical red flags such as fever, bony tenderness, lower extremity weakness, saddle anesthesia. Imaging not indicated at this time.   Patient states prednisone works well for her.  Will have her discontinue the ibuprofen, 6-day prednisone taper since that works well for her, Naprosyn combined with Tylenol twice a day.  PT, ice or heat, whichever feels better.  She declined a referral to sports medicine.  Follow-up with PCP as needed.  Discussed  MDM, treatment plan, and plan for follow-up with patient. . patient agrees with plan.   Meds ordered this encounter  Medications   naproxen (NAPROSYN) 500 MG tablet    Sig: Take 1 tablet (500 mg total) by mouth 2 (two) times daily.    Dispense:  20 tablet    Refill:  0   predniSONE (STERAPRED UNI-PAK 21 TAB) 10 MG (21) TBPK tablet    Sig: Dispense one 6 day pack. Take as directed with food.    Dispense:  21 tablet    Refill:  0    *This clinic note was created using Scientist, clinical (histocompatibility and immunogenetics). Therefore, there may be occasional mistakes despite careful proofreading.  ?     Domenick Gong, MD 03/01/23 1113

## 2023-03-01 NOTE — Discharge Instructions (Signed)
 Take the Naprosyn with 1000 mg of Tylenol twice a day.  May take an additional 1000 mg Tylenol 1 more time a day.  Finish the prednisone, if you feel better.  Ice or heat, whichever feels better, gentle stretching.  PT may also be helpful.

## 2023-03-02 ENCOUNTER — Encounter: Payer: Self-pay | Admitting: Obstetrics and Gynecology

## 2023-03-02 ENCOUNTER — Telehealth: Payer: BC Managed Care – PPO | Admitting: Obstetrics and Gynecology

## 2023-03-02 DIAGNOSIS — Z79890 Hormone replacement therapy (postmenopausal): Secondary | ICD-10-CM

## 2023-03-02 DIAGNOSIS — N951 Menopausal and female climacteric states: Secondary | ICD-10-CM

## 2023-03-02 DIAGNOSIS — N3000 Acute cystitis without hematuria: Secondary | ICD-10-CM | POA: Diagnosis not present

## 2023-03-02 MED ORDER — ESTRADIOL-NORETHINDRONE ACET 1-0.5 MG PO TABS: 1.0000 | ORAL_TABLET | Freq: Every day | ORAL | 0 refills | Status: DC

## 2023-03-02 MED ORDER — SULFAMETHOXAZOLE-TRIMETHOPRIM 800-160 MG PO TABS: 1.0000 | ORAL_TABLET | Freq: Two times a day (BID) | ORAL | 1 refills | Status: AC

## 2023-03-02 NOTE — Progress Notes (Signed)
 HPI:      [name] is a 53 y.o. G5P0010 who LMP was Patient's last menstrual period was 02/07/2023.  Subjective:   She presents today for follow-up of her FSH testing because she was having menopausal symptoms off of OCPs.  She had been taking OCPs for DU B and this DU B resolved with OCPs over the last few years.  She stopped OCPs 2 weeks before her annual examination and was tested for menopause with an [facility].  Her FSH has subsequently returned as elevated consistent with menopause. She was also found to have a urinary tract infection at her last visit.  Her culture subsequently grew out Klebsiella which was intermediate resistant to Macrobid.  She finished her Macrobid yesterday and is having some pressure symptoms.    Hx: The following portions of the patient's history were reviewed and updated as appropriate:             She  has a past medical history of GERD (gastroesophageal reflux disease), Headache, and History of 2019 novel coronavirus disease (COVID-19) (01/11/2019). She does not have any pertinent problems on file. She  has a past surgical history that includes Dilation and curettage of uterus (2000); Tubal ligation (2006); laparoscopy (2009); and Ovary surgery (2009). Her family history includes Breast cancer (age of onset: 71) in her maternal grandmother; Uterine cancer (age of onset: 49) in her mother. She  reports that she has never smoked. She has never used smokeless tobacco. She reports that she does not currently use alcohol. She reports that she does not use drugs. She has a current medication list which includes the following prescription(s): albuterol, fluticasone, meclizine, naproxen, norethindrone-ethinyl estradiol-iron, pravastatin, and prednisone. She has no known allergies.       Review of Systems:  Review of Systems  Constitutional: Denied constitutional symptoms, night sweats, recent illness, fatigue, fever, insomnia and weight loss.  Eyes: Denied eye  symptoms, eye pain, photophobia, vision change and visual disturbance.  Ears/Nose/Throat/Neck: Denied ear, nose, throat or neck symptoms, hearing loss, nasal discharge, sinus congestion and sore throat.  Cardiovascular: Denied cardiovascular symptoms, arrhythmia, chest pain/pressure, edema, exercise intolerance, orthopnea and palpitations.  Respiratory: Denied pulmonary symptoms, asthma, pleuritic pain, productive sputum, cough, dyspnea and wheezing.  Gastrointestinal: Denied, gastro-esophageal reflux, melena, nausea and vomiting.  Genitourinary: See HPI for additional information.  Musculoskeletal: Denied musculoskeletal symptoms, stiffness, swelling, muscle weakness and myalgia.  Dermatologic: Denied dermatology symptoms, rash and scar.  Neurologic: Denied neurology symptoms, dizziness, headache, neck pain and syncope.  Psychiatric: Denied psychiatric symptoms, anxiety and depression.  Endocrine: See HPI for additional information.   Meds:   Current Outpatient Medications on File Prior to Visit  Medication Sig Dispense Refill   albuterol (VENTOLIN HFA) 108 (90 Base) MCG/ACT inhaler Inhale 1-2 puffs into the lungs every 4 (four) hours as needed for wheezing or shortness of breath. 18 g 0   fluticasone (FLONASE) 50 MCG/ACT nasal spray Place 1 spray into both nostrils daily. 15.8 mL 0   meclizine (ANTIVERT) 25 MG tablet Take 1 tablet (25 mg total) by mouth 3 (three) times daily as needed for dizziness. 30 tablet 0   naproxen (NAPROSYN) 500 MG tablet Take 1 tablet (500 mg total) by mouth 2 (two) times daily. 20 tablet 0   norethindrone-ethinyl estradiol-iron (LOESTRIN FE 1.5/30) 1.5-30 MG-MCG tablet Take 1 tablet by mouth at bedtime. 30 tablet 0   pravastatin (PRAVACHOL) 40 MG tablet Take 40 mg by mouth daily.     predniSONE (  STERAPRED UNI-PAK 21 TAB) 10 MG (21) TBPK tablet Dispense one 6 day pack. Take as directed with food. 21 tablet 0   No current facility-administered medications on file  prior to visit.      Objective:     There were no vitals filed for this visit. There were no vitals filed for this visit.                      Assessment:    G5P0010 Patient Active Problem List   Diagnosis Date Noted   Acute suppurative otitis media of left ear without spontaneous rupture of tympanic membrane 01/28/2023   Acute URI 01/28/2023   Right sciatic nerve pain 01/28/2023   Breast mass, right 12/12/2014     1. Menopausal symptom   2. Hormone replacement therapy (HRT)   3. Acute cystitis without hematuria     Patient found to be in menopause by symptoms and by [facility] testing.  Klebsiella UTI with symptoms.   Plan:            1.  HRT I have discussed HRT with the patient in detail.  The risk/benefits of it were reviewed.  She understands that during menopause Estrogen decreases dramatically and that this results in an increased risk of cardiovascular disease as well as osteoporosis.  We have also discussed the fact that hot flashes often result from a decrease in Estrogen, and that by replacing Estrogen, they can often be alleviated.  We have discussed skin, vaginal and urinary tract changes that may also take place from this drop in Estrogen.  Emotional changes have also been linked to Estrogen and we have briefly discussed this.  The benefits of HRT including decrease in hot flashes, vaginal dryness, and osteoporosis were discussed.  The emotional benefit and a possible change in her cardiovascular risk profile was also reviewed.  The risks associated with Hormone Replacement Therapy were also reviewed.  The use of unopposed Estrogen and its relationship to endometrial cancer was discussed.  The addition of Progesterone and its beneficial effect on endometrial cancer was also noted.  The fact that there has been no consistent definitive studies showing an increase in breast cancer in women who use HRT was discussed with the patient.  The possible side effects including  breast tenderness, fluid retention, mood changes and vaginal bleeding were discussed.  The patient was informed that this is an elective medication and that she may choose not to take Hormone Replacement Therapy.   Patient has elected to begin Activella -replacement of OCPs. 2.  Because she continues to have symptoms and because Klebsiella is intermediately resistant to Macrobid I have prescribed Bactrim for 1 week.  She will begin this immediately.   Orders No orders of the defined types were placed in this encounter.   No orders of the defined types were placed in this encounter.     F/U  Return in about 3 months (around 05/30/2023).  Elonda Husky, M.D. 03/02/2023 8:16 AM

## 2023-05-27 ENCOUNTER — Encounter: Payer: Self-pay | Admitting: Obstetrics and Gynecology

## 2023-05-27 ENCOUNTER — Telehealth (INDEPENDENT_AMBULATORY_CARE_PROVIDER_SITE_OTHER): Admitting: Obstetrics and Gynecology

## 2023-05-27 DIAGNOSIS — Z79890 Hormone replacement therapy (postmenopausal): Secondary | ICD-10-CM

## 2023-05-27 DIAGNOSIS — N951 Menopausal and female climacteric states: Secondary | ICD-10-CM | POA: Diagnosis not present

## 2023-05-27 MED ORDER — ESTRADIOL-NORETHINDRONE ACET 1-0.5 MG PO TABS: 1.0000 | ORAL_TABLET | Freq: Every day | ORAL | 2 refills | Status: DC

## 2023-05-27 NOTE — Progress Notes (Addendum)
 Virtual Visit via Video Note  I connected with Jenny Luna on 05/27/23 at  7:35 AM EDT by video and verified that I was speaking with the correct person using two identifiers.    [name] is a 53 y.o. G5P0010 who LMP was No LMP recorded. I discussed the limitations, risks, security and privacy concerns of performing an evaluation and management service by video and the availability of in person appointments. I also discussed with the patient that there may be a patient responsible charge related to this service. The patient expressed understanding and agreed to proceed.  Location of patient:  [place]  Patient gave explicit verbal consent for video visit:  YES  Location of provider:  [facility] office  Persons other than physician and patient involved in provider conference:  None   Subjective:   History of Present Illness:    She was taking OCPs and took a break from that and was tested and found to have an elevated FSH consistent with menopause.  Since that time she has started Activella.  She reports that the transition has been relatively seamless.  She is not having hot flashes or any other issues.  She states that she did have a few days of spotting but this has resolved.  She is currently happy on this medication. She was also previously treated for Klebsiella urinary tract infection.  She states she took all the medications.  She still has some pressure symptoms and is not sure if she currently has an infection.  Hx: The following portions of the patient's history were reviewed and updated as appropriate:             She  has a past medical history of GERD (gastroesophageal reflux disease), Headache, and History of 2019 novel coronavirus disease (COVID-19) (01/11/2019). She does not have any pertinent problems on file. She  has a past surgical history that includes Dilation and curettage of uterus (2000); Tubal ligation (2006); laparoscopy (2009); and Ovary surgery (2009). Her  family history includes Breast cancer (age of onset: 37) in her maternal grandmother; Uterine cancer (age of onset: 71) in her mother. She  reports that she has never smoked. She has never used smokeless tobacco. She reports that she does not currently use alcohol. She reports that she does not use drugs. She has a current medication list which includes the following prescription(s): albuterol , estradiol -norethindrone , fluticasone , meclizine , naproxen , pravastatin, prednisone , and sulfamethoxazole -trimethoprim . She has no known allergies.       Review of Systems:  Review of Systems  Constitutional: Denied constitutional symptoms, night sweats, recent illness, fatigue, fever, insomnia and weight loss.  Eyes: Denied eye symptoms, eye pain, photophobia, vision change and visual disturbance.  Ears/Nose/Throat/Neck: Denied ear, nose, throat or neck symptoms, hearing loss, nasal discharge, sinus congestion and sore throat.  Cardiovascular: Denied cardiovascular symptoms, arrhythmia, chest pain/pressure, edema, exercise intolerance, orthopnea and palpitations.  Respiratory: Denied pulmonary symptoms, asthma, pleuritic pain, productive sputum, cough, dyspnea and wheezing.  Gastrointestinal: Denied, gastro-esophageal reflux, melena, nausea and vomiting.  Genitourinary: See HPI for additional information.  Musculoskeletal: Denied musculoskeletal symptoms, stiffness, swelling, muscle weakness and myalgia.  Dermatologic: Denied dermatology symptoms, rash and scar.  Neurologic: Denied neurology symptoms, dizziness, headache, neck pain and syncope.  Psychiatric: Denied psychiatric symptoms, anxiety and depression.  Endocrine: Denied endocrine symptoms including hot flashes and night sweats.   Meds:   Current Outpatient Medications on File Prior to Visit  Medication Sig Dispense Refill   albuterol  (VENTOLIN  HFA)  108 (90 Base) MCG/ACT inhaler Inhale 1-2 puffs into the lungs every 4 (four) hours as needed  for wheezing or shortness of breath. 18 g 0   fluticasone  (FLONASE ) 50 MCG/ACT nasal spray Place 1 spray into both nostrils daily. 15.8 mL 0   meclizine  (ANTIVERT ) 25 MG tablet Take 1 tablet (25 mg total) by mouth 3 (three) times daily as needed for dizziness. 30 tablet 0   naproxen  (NAPROSYN ) 500 MG tablet Take 1 tablet (500 mg total) by mouth 2 (two) times daily. 20 tablet 0   pravastatin (PRAVACHOL) 40 MG tablet Take 40 mg by mouth daily.     predniSONE  (STERAPRED UNI-PAK 21 TAB) 10 MG (21) TBPK tablet Dispense one 6 day pack. Take as directed with food. 21 tablet 0   sulfamethoxazole -trimethoprim  (BACTRIM  DS) 800-160 MG tablet Take 1 tablet by mouth 2 (two) times daily. 14 tablet 1   No current facility-administered medications on file prior to visit.    Assessment:     G5P0010 Patient Active Problem List   Diagnosis Date Noted   Acute suppurative otitis media of left ear without spontaneous rupture of tympanic membrane 01/28/2023   Acute URI 01/28/2023   Right sciatic nerve pain 01/28/2023   Breast mass, right 12/12/2014     1. Menopausal symptom   2. Hormone replacement therapy (HRT)     Patient doing very well on HRT.  Would like to continue.  Plan:            1.  Continue HRT  2.  Advised to have cultures performed if she continues to have urinary symptoms. Orders No orders of the defined types were placed in this encounter.    Meds ordered this encounter  Medications   estradiol -norethindrone  (ACTIVELLA) 1-0.5 MG tablet    Sig: Take 1 tablet by mouth daily.    Dispense:  90 tablet    Refill:  2      F/U  Return for Annual Physical.  Delice Felt, M.D. 05/27/2023 7:51 AM

## 2024-02-04 ENCOUNTER — Other Ambulatory Visit: Payer: Self-pay

## 2024-02-04 DIAGNOSIS — N951 Menopausal and female climacteric states: Secondary | ICD-10-CM

## 2024-02-04 DIAGNOSIS — Z79890 Hormone replacement therapy (postmenopausal): Secondary | ICD-10-CM

## 2024-02-04 MED ORDER — ESTRADIOL-NORETHINDRONE ACET 1-0.5 MG PO TABS: 1.0000 | ORAL_TABLET | Freq: Every day | ORAL | 0 refills | Status: AC

## 2024-02-04 NOTE — Telephone Encounter (Signed)
 The patient called and scheduled an annual appointment with Jinnie for March. A refill of estradiol  was sent in to last until her appointment.

## 2024-03-16 ENCOUNTER — Ambulatory Visit: Admitting: Licensed Practical Nurse
